# Patient Record
Sex: Female | Born: 1970
Health system: Southern US, Community
[De-identification: ages and names within clinical notes are randomized; demographics above are authoritative.]

## PROBLEM LIST (undated history)

## (undated) DIAGNOSIS — J45909 Unspecified asthma, uncomplicated: Secondary | ICD-10-CM

## (undated) DIAGNOSIS — R296 Repeated falls: Secondary | ICD-10-CM

## (undated) DIAGNOSIS — R011 Cardiac murmur, unspecified: Secondary | ICD-10-CM

## (undated) DIAGNOSIS — G43909 Migraine, unspecified, not intractable, without status migrainosus: Secondary | ICD-10-CM

## (undated) DIAGNOSIS — M810 Age-related osteoporosis without current pathological fracture: Secondary | ICD-10-CM

## (undated) DIAGNOSIS — M797 Fibromyalgia: Secondary | ICD-10-CM

## (undated) HISTORY — DX: Repeated falls: R29.6

## (undated) HISTORY — PX: HAND SURGERY: SHX662

## (undated) HISTORY — PX: CHOLECYSTECTOMY: SHX55

## (undated) HISTORY — PX: KNEE SURGERY: SHX244

## (undated) HISTORY — PX: ABDOMINAL HYSTERECTOMY: SHX81

## (undated) HISTORY — PX: APPENDECTOMY: SHX54

## (undated) HISTORY — DX: Migraine, unspecified, not intractable, without status migrainosus: G43.909

---

## 2016-12-13 ENCOUNTER — Other Ambulatory Visit: Payer: Self-pay | Admitting: Rheumatology

## 2016-12-13 DIAGNOSIS — M47819 Spondylosis without myelopathy or radiculopathy, site unspecified: Secondary | ICD-10-CM

## 2016-12-28 ENCOUNTER — Ambulatory Visit: Payer: Self-pay | Admitting: Rheumatology

## 2017-01-01 ENCOUNTER — Ambulatory Visit
Admission: RE | Admit: 2017-01-01 | Discharge: 2017-01-01 | Disposition: A | Discharge status: Home / Self Care | Payer: 59 | Source: Ambulatory Visit | Attending: Rheumatology | Admitting: Rheumatology

## 2017-01-01 ENCOUNTER — Encounter: Payer: Self-pay | Admitting: Radiology

## 2017-01-01 DIAGNOSIS — M47819 Spondylosis without myelopathy or radiculopathy, site unspecified: Secondary | ICD-10-CM

## 2017-01-01 MED ORDER — GADOBENATE DIMEGLUMINE 529 MG/ML IV SOLN
20.0000 mL | Freq: Once | INTRAVENOUS | Status: AC | PRN
  Administered 2017-01-01: 20 mL via INTRAVENOUS

## 2017-01-07 ENCOUNTER — Other Ambulatory Visit: Payer: Self-pay | Admitting: Rheumatology

## 2017-01-07 DIAGNOSIS — M545 Low back pain: Secondary | ICD-10-CM

## 2017-02-16 ENCOUNTER — Other Ambulatory Visit: Payer: 59

## 2017-07-15 DIAGNOSIS — J40 Bronchitis, not specified as acute or chronic: Secondary | ICD-10-CM | POA: Diagnosis not present

## 2017-08-10 DIAGNOSIS — J111 Influenza due to unidentified influenza virus with other respiratory manifestations: Secondary | ICD-10-CM | POA: Diagnosis not present

## 2017-08-10 DIAGNOSIS — J209 Acute bronchitis, unspecified: Secondary | ICD-10-CM | POA: Diagnosis not present

## 2017-10-29 ENCOUNTER — Emergency Department (HOSPITAL_COMMUNITY): Payer: BLUE CROSS/BLUE SHIELD

## 2017-10-29 ENCOUNTER — Emergency Department (HOSPITAL_COMMUNITY)
Admission: EM | Admit: 2017-10-29 | Discharge: 2017-10-30 | Disposition: A | Discharge status: Home / Self Care | Payer: BLUE CROSS/BLUE SHIELD | Attending: Emergency Medicine | Admitting: Emergency Medicine

## 2017-10-29 ENCOUNTER — Encounter (HOSPITAL_COMMUNITY): Payer: Self-pay

## 2017-10-29 ENCOUNTER — Emergency Department (HOSPITAL_BASED_OUTPATIENT_CLINIC_OR_DEPARTMENT_OTHER)
Admit: 2017-10-29 | Discharge: 2017-10-29 | Disposition: A | Discharge status: Home / Self Care | Payer: BLUE CROSS/BLUE SHIELD

## 2017-10-29 DIAGNOSIS — M79609 Pain in unspecified limb: Secondary | ICD-10-CM | POA: Diagnosis not present

## 2017-10-29 DIAGNOSIS — M79622 Pain in left upper arm: Secondary | ICD-10-CM | POA: Insufficient documentation

## 2017-10-29 DIAGNOSIS — J45909 Unspecified asthma, uncomplicated: Secondary | ICD-10-CM | POA: Insufficient documentation

## 2017-10-29 DIAGNOSIS — Z87891 Personal history of nicotine dependence: Secondary | ICD-10-CM | POA: Insufficient documentation

## 2017-10-29 DIAGNOSIS — M79602 Pain in left arm: Secondary | ICD-10-CM

## 2017-10-29 DIAGNOSIS — M542 Cervicalgia: Secondary | ICD-10-CM | POA: Diagnosis not present

## 2017-10-29 DIAGNOSIS — M7989 Other specified soft tissue disorders: Secondary | ICD-10-CM | POA: Diagnosis not present

## 2017-10-29 DIAGNOSIS — R079 Chest pain, unspecified: Secondary | ICD-10-CM | POA: Diagnosis not present

## 2017-10-29 HISTORY — DX: Age-related osteoporosis without current pathological fracture: M81.0

## 2017-10-29 HISTORY — DX: Unspecified asthma, uncomplicated: J45.909

## 2017-10-29 HISTORY — DX: Fibromyalgia: M79.7

## 2017-10-29 HISTORY — DX: Cardiac murmur, unspecified: R01.1

## 2017-10-29 LAB — CBC
HCT: 42.3 % (ref 36.0–46.0)
Hemoglobin: 14.2 g/dL (ref 12.0–15.0)
MCH: 29.6 pg (ref 26.0–34.0)
MCHC: 33.6 g/dL (ref 30.0–36.0)
MCV: 88.3 fL (ref 78.0–100.0)
Platelets: 236 10*3/uL (ref 150–400)
RBC: 4.79 MIL/uL (ref 3.87–5.11)
RDW: 14.3 % (ref 11.5–15.5)
WBC: 9.5 10*3/uL (ref 4.0–10.5)

## 2017-10-29 LAB — BASIC METABOLIC PANEL
Anion gap: 11 (ref 5–15)
BUN: 11 mg/dL (ref 6–20)
CO2: 23 mmol/L (ref 22–32)
Calcium: 9.3 mg/dL (ref 8.9–10.3)
Chloride: 104 mmol/L (ref 101–111)
Creatinine, Ser: 1.02 mg/dL — ABNORMAL HIGH (ref 0.44–1.00)
GFR calc Af Amer: >60 mL/min (ref >60)
GFR calc non Af Amer: >60 mL/min (ref >60)
Glucose, Bld: 96 mg/dL (ref 65–99)
Potassium: 4 mmol/L (ref 3.5–5.1)
Sodium: 138 mmol/L (ref 135–145)

## 2017-10-29 LAB — I-STAT TROPONIN, ED: Troponin i, poc: 0 ng/mL (ref 0.00–0.08)

## 2017-10-29 MED ORDER — KETOROLAC TROMETHAMINE 30 MG/ML IJ SOLN
30.0000 mg | Freq: Once | INTRAMUSCULAR | Status: AC
  Administered 2017-10-30: 30 mg via INTRAMUSCULAR
  Filled 2017-10-29: qty 1

## 2017-10-29 MED ORDER — NAPROXEN 500 MG PO TABS: 500.0000 mg | ORAL_TABLET | Freq: Two times a day (BID) | ORAL | 0 refills | Status: DC

## 2017-10-29 NOTE — ED Provider Notes (Signed)
Patient placed in Quick Look pathway, seen and evaluated   Chief Complaint: Left upper extremity pain, chest pain  HPI:   Patient with history of fibromyalgia and cardiac murmur presents for evaluation of acute onset, waxing and waning left upper extremity pain which began while she was at work moving socks.  No heavy lifting.  Pain began in the left upper arm and she noted some swelling which improved.  Pain then began to radiate down the rest of the upper extremity.  Worsens with any movement or palpation.  Specifically to the inner arm.  While she was walking to her vehicle she experienced left-sided chest pain and pressure which resolved at rest.  Denies shortness of breath, fevers, numbness, or tingling.  States arm feels heavy.  She has had a DVT in the past when she was pregnant but did not require blood thinners.  No recent travel or surgeries, no hemoptysis, not on OCPs or estrogen therapy.  ROS: Positive for extremity pain, chest pain negative for shortness of breath, fevers, cough, chills  Physical Exam:   Gen: No distress  Neuro: Awake and Alert  Skin: Warm    Focused Exam: 2+ radial pulses bilaterally.  Maximally tender to palpation along the inner aspect of the left upper arm.  No swelling noted.  5/5 strength of BUE major muscle groups.  Heart regular rate and rhythm, lungs clear to auscultation bilaterally.  Mild tenderness to palpation of the left upper anterior chest wall.  No deformity, crepitus, ecchymosis, or paradoxical wall motion   Initiation of care has begun. The patient has been counseled on the process, plan, and necessity for staying for the completion/evaluation, and the remainder of the medical screening examination    Bennye AlmFawze, Viviane Semidey A, PA-C 10/29/17 1617    Wilkie AyeHorton, Mayer Maskerourtney F, MD 10/29/17 2348

## 2017-10-29 NOTE — Discharge Instructions (Addendum)
You were seen today for left arm pain.  The cause of your pain at this time is unknown.  Your ultrasound was negative for DVT.  Your heart testing is reassuring.  Take naproxen for the next 3 to 5 days to see if this helps.  If not improving, follow-up with your primary physician.

## 2017-10-29 NOTE — ED Provider Notes (Signed)
[facility] Provider Note   CSN: [id number] Arrival date & time: 10/29/17  1432     History   Chief Complaint Chief Complaint  Patient presents with  . Arm Pain    HPI Karen Vargas is a 47 y.o. female.  HPI  This is a 47 year old female with a history of fibromyalgia, osteoporosis and remote history of DVT who presents with left arm pain.  Patient reports that she woke up this morning with left arm pain and stiffness.  She thought she slept on her arm wrong.  Pain is mostly in the left upper arm and shoots both downward and upward into her shoulder.  She reports some neck stiffness but no neck pain.  No worsening of pain with neck movement.  Pain is worse with movement at the elbow.  Currently her pain is 6 out of 10.  She describes it as achy.  She denies any numbness or tingling in her fingers.  She denies any weakness.  She has not taken anything for her pain.  She denies any chest pain, shortness of breath, abdominal pain, nausea, vomiting.  Past Medical History:  Diagnosis Date  . Asthma   . Cardiac murmur   . Fibromyalgia   . Osteoporosis     There are no active problems to display for this patient.   Past Surgical History:  Procedure Laterality Date  . ABDOMINAL HYSTERECTOMY    . APPENDECTOMY    . CHOLECYSTECTOMY    . HAND SURGERY Right   . KNEE SURGERY       OB History   None      Home Medications    Prior to Admission medications   Medication Sig Start Date End Date Taking? Authorizing Provider  naproxen (NAPROSYN) 500 MG tablet Take 1 tablet (500 mg total) by mouth 2 (two) times daily. Limit use to 3-5 days 10/29/17   Horton, Mayer Maskerourtney F, MD    Family History History reviewed. No pertinent family history.  Social History Social History   Tobacco Use  . Smoking status: Former Games developermoker  . Smokeless tobacco: Never Used  Substance Use Topics  . Alcohol use: Yes    Comment: occ  . Drug use: Never      Allergies   Doxycycline; Influenza vaccines; and Tramadol   Review of Systems Review of Systems  Constitutional: Negative for fever.  Respiratory: Negative for shortness of breath.   Cardiovascular: Negative for chest pain.  Musculoskeletal: Positive for neck stiffness. Negative for neck pain.       Left arm pain  Neurological: Negative for weakness and numbness.  All other systems reviewed and are negative.    Physical Exam Updated Vital Signs BP 137/85 (BP Location: Right Arm)   Pulse 72   Temp (!) 97.3 F (36.3 C) (Oral)   Resp 18   Ht 5\' 4"  (1.626 m)   Wt 108.9 kg (240 lb)   SpO2 95%   BMI 41.20 kg/m   Physical Exam  Constitutional: She is oriented to person, place, and time. She appears well-developed and well-nourished. No distress.  HENT:  Head: Normocephalic and atraumatic.  Cardiovascular: Normal rate, regular rhythm and normal heart sounds.  Pulmonary/Chest: Effort normal and breath sounds normal. No respiratory distress. She has no wheezes.  Abdominal: Soft. There is no tenderness.  Musculoskeletal:  Normal range of motion of the left shoulder and elbow, no obvious deformity, tenderness to palpation over the left mid humerus, no obvious contusion  or overlying skin changes, 2+ DP pulse No midline C-spine tenderness to palpation, step-off, deformity, no tenderness noted in the brachial plexus  Neurological: She is alert and oriented to person, place, and time.  5 out of 5 grip, biceps, triceps strength bilaterally  Skin: Skin is warm and dry.  Psychiatric: She has a normal mood and affect.  Nursing note and vitals reviewed.    ED Treatments / Results  Labs (all labs ordered are listed, but only abnormal results are displayed) Labs Reviewed  BASIC METABOLIC PANEL - Abnormal; Notable for the following components:      Result Value   Creatinine, Ser 1.02 (*)    All other components within normal limits  CBC  I-STAT TROPONIN, ED    EKG EKG  Interpretation  Date/Time:  Tuesday October 29 2017 16:06:13 EDT Ventricular Rate:  88 PR Interval:  148 QRS Duration: 86 QT Interval:  358 QTC Calculation: 433 R Axis:   -14 Text Interpretation:  Normal sinus rhythm Normal ECG No prior Confirmed by Ross Marcus ([id number]) on 10/29/2017 11:03:45 PM   Radiology Dg Chest 2 View  Result Date: 10/29/2017 CLINICAL DATA:  Worsening LEFT arm pain, tingling, RIGHT painful and constant, single episode of mid chest pain when moving LEFT arm, history asthma, former smoker, fibromyalgia EXAM: CHEST - 2 VIEW COMPARISON:  None FINDINGS: Normal heart size, mediastinal contours, and pulmonary vascularity. Lungs clear. No pleural effusion or pneumothorax. Bones unremarkable. IMPRESSION: Normal exam. Electronically Signed   By: Ulyses Southward M.D.   On: 10/29/2017 16:27    Procedures Procedures (including critical care time)  Medications Ordered in ED Medications  ketorolac (TORADOL) 30 MG/ML injection 30 mg (has no administration in time range)     Initial Impression / Assessment and Plan / ED Course  I have reviewed the triage vital signs and the nursing notes.  Pertinent labs & imaging results that were available during my care of the patient were reviewed by me and considered in my medical decision making (see chart for details).     Patient presents with left arm pain.  Atraumatic.  She has no obvious deformities.  She is neurologically intact.  Considerations include blood clot, radiculopathy, less likely fracture.  Lab work reviewed.  EKG is nonischemic.  Chest x-ray is negative without evidence of pneumothorax or pneumonia.  Ultrasound is negative for clot.  Pain appears musculoskeletal in nature versus radicular.  Recommend anti-inflammatory medications for 3 to 5 days.  Follow-up primary physician if not improving.  After history, exam, and medical workup I feel the patient has been appropriately medically screened and is safe for discharge  home. Pertinent diagnoses were discussed with the patient. Patient was given return precautions.   Final Clinical Impressions(s) / ED Diagnoses   Final diagnoses:  Left arm pain    ED Discharge Orders        Ordered    naproxen (NAPROSYN) 500 MG tablet  2 times daily     10/29/17 2347       Horton, Mayer Masker, MD 10/29/17 2355

## 2017-10-29 NOTE — ED Triage Notes (Signed)
Pt woke up this morning with left arm pain that is worsening.  Pain in upper and lower arm, very painful, tingling, and constant.  No respiratory difficulties.  Pt had one episode while moving left arm of mid chest pain.  No appetite today.

## 2017-10-29 NOTE — ED Notes (Signed)
Called Pt for update. No answer

## 2017-10-29 NOTE — Progress Notes (Signed)
Preliminary results by tech - Left Upper Ext. Venous Duplex Completed. Negative for deep and superficial vein thrombosis. Jermane Brayboy, BS, RDMS, RVT  

## 2017-10-29 NOTE — ED Notes (Signed)
ED Provider at bedside. 

## 2017-10-30 NOTE — ED Notes (Signed)
Sign pad malfunctioned; pt unable to sign-Monique,RN

## 2018-01-27 DIAGNOSIS — S161XXA Strain of muscle, fascia and tendon at neck level, initial encounter: Secondary | ICD-10-CM | POA: Diagnosis not present

## 2018-07-05 IMAGING — MR MR PELVIS WO/W CM
4 of 8 series · 19 of 48 positions shown · IV contrast (multihance)
Comparison: None.

CLINICAL DATA: Back pain. Radiates down the right hip pain
sometimes left hip.

EXAM:
MRI PELVIS WITHOUT AND WITH CONTRAST
TECHNIQUE: Multiplanar multisequence MR imaging of the pelvis was performed
both before and after administration of intravenous contrast.
CONTRAST:  20mL MULTIHANCE GADOBENATE DIMEGLUMINE 529 MG/ML IV SOLN

[Series 4: T1 · sagittal · 4.0mm · 0.43mm/px · 6 of 30 slices shown (1 of 3)]
[im 1/30]
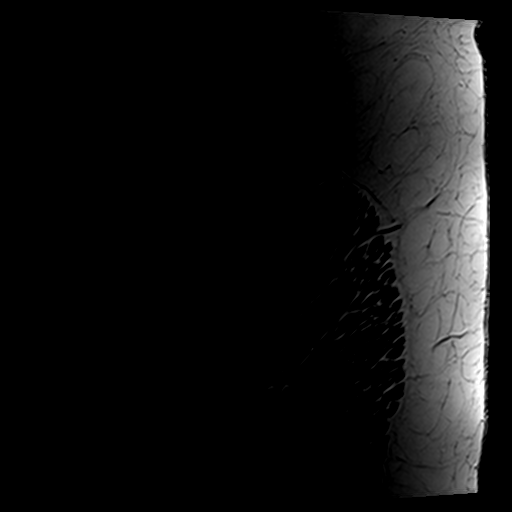
[im 6/30]
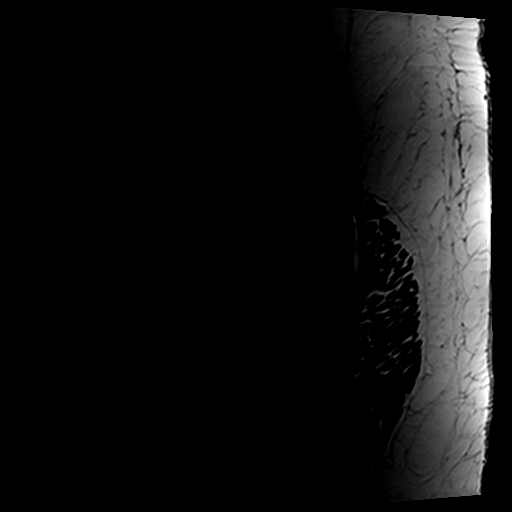
[im 12/30]
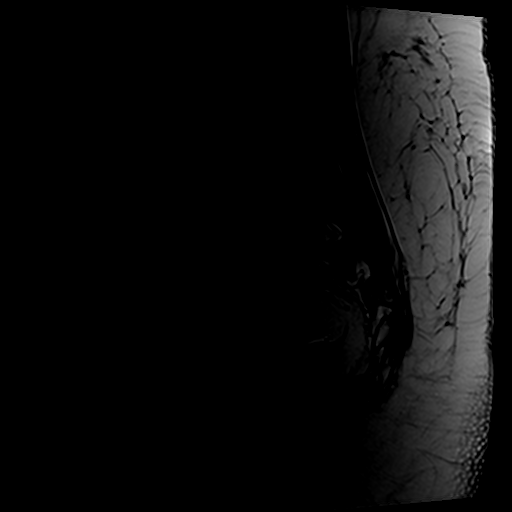
[im 18/30]
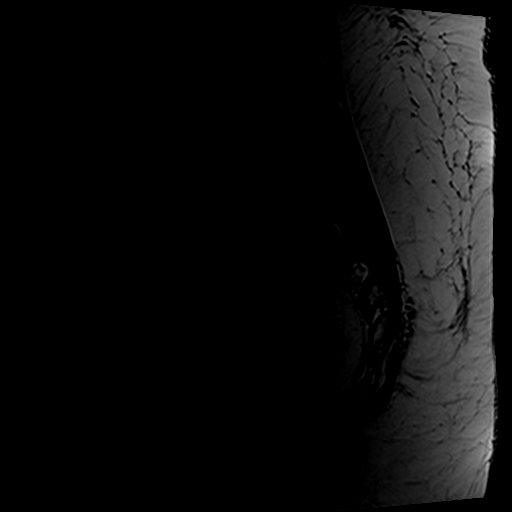
[im 24/30]
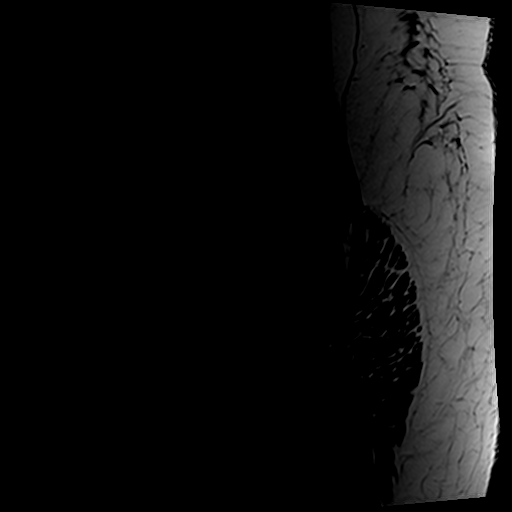
[im 30/30]
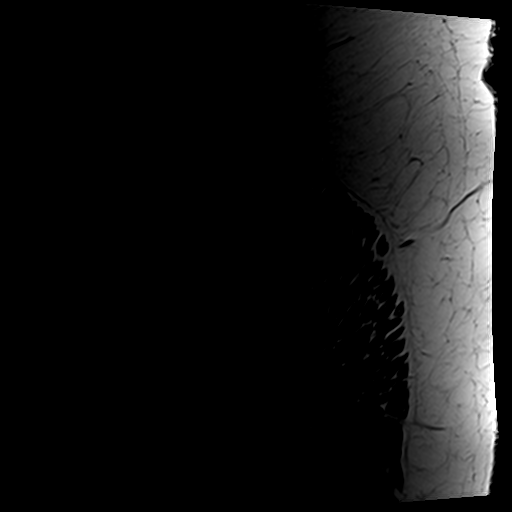

[Series 5: T1 · oblique · 5.0mm · 0.43mm/px · 6 of 30 slices shown (2 of 3)]
[im 1/30]
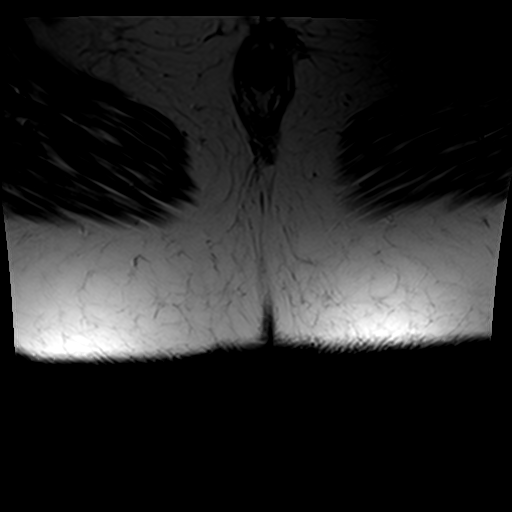
[im 6/30]
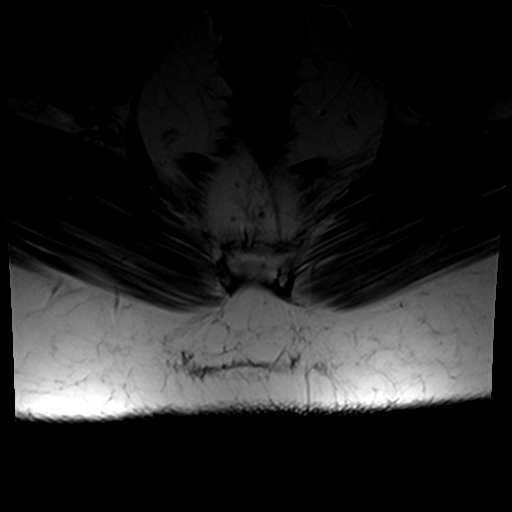
[im 12/30]
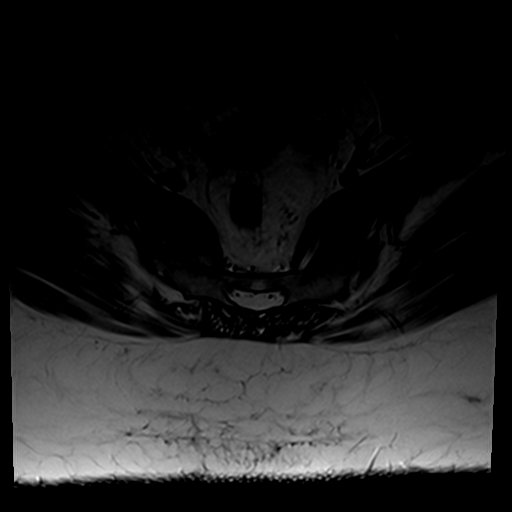
[im 18/30]
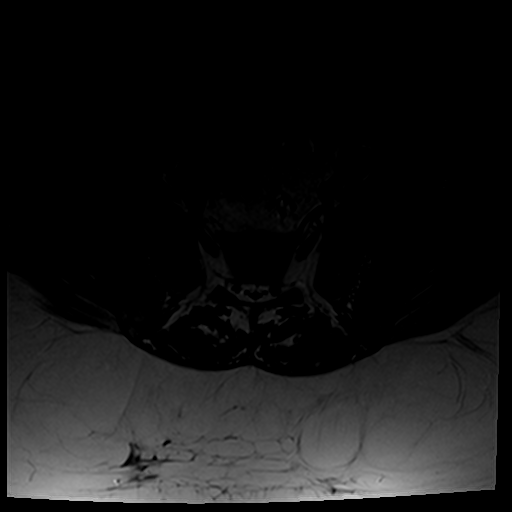
[im 24/30]
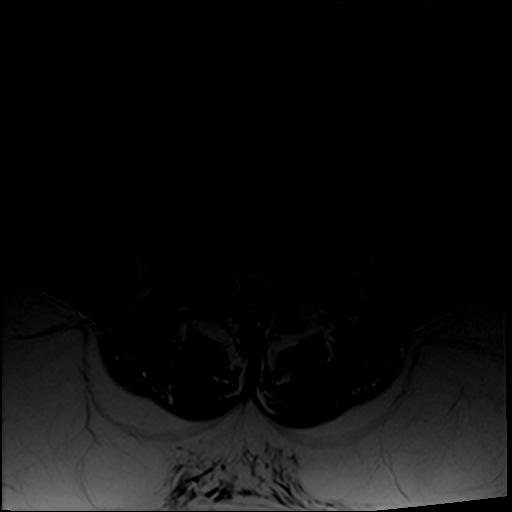
[im 30/30]
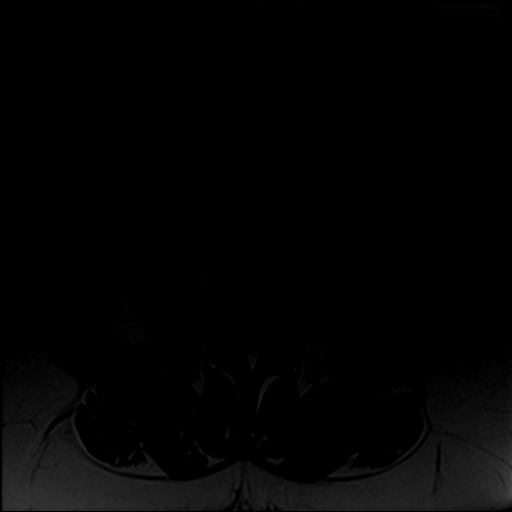

[Series 6: T1 fat-sat · oblique · non-contrast · 5.0mm · 0.43mm/px · 4 of 30 slices shown]
[im 1/30]
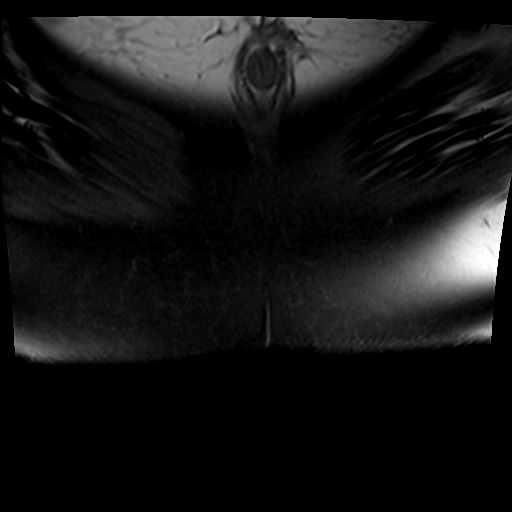
[im 5/30]
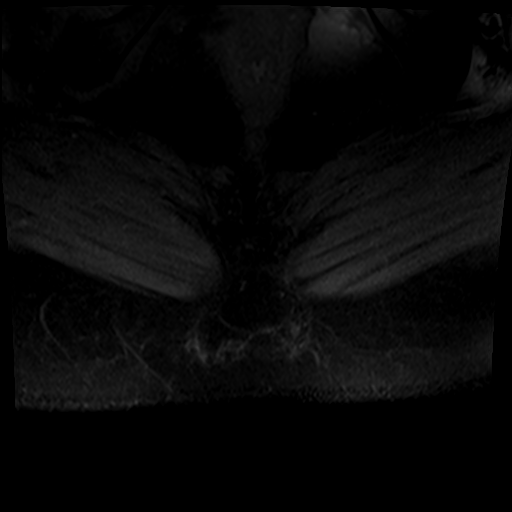
[im 15/30]
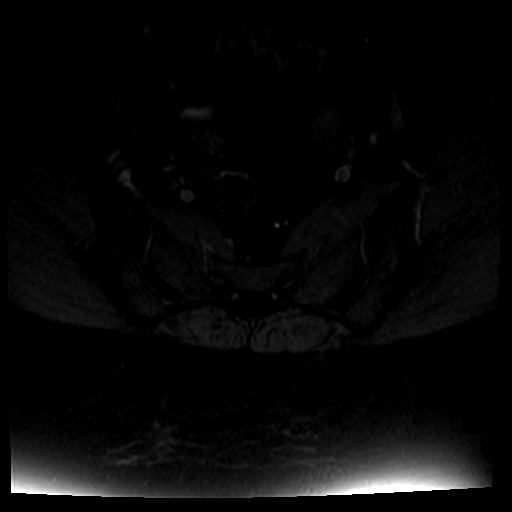
[im 25/30]
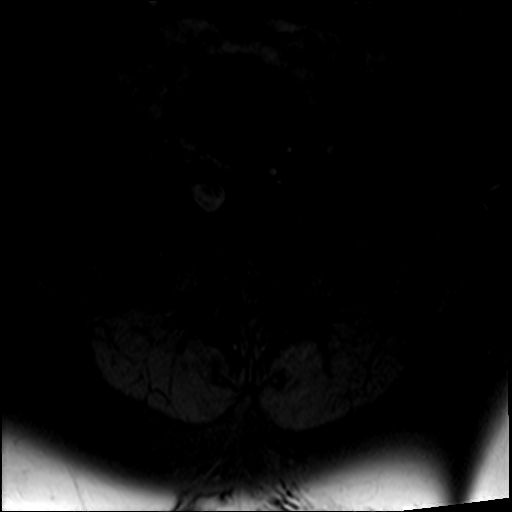

[Series 8: T1 · coronal · 5.0mm · 0.43mm/px · 3 of 22 slices shown (3 of 3)]
[im 1/22]
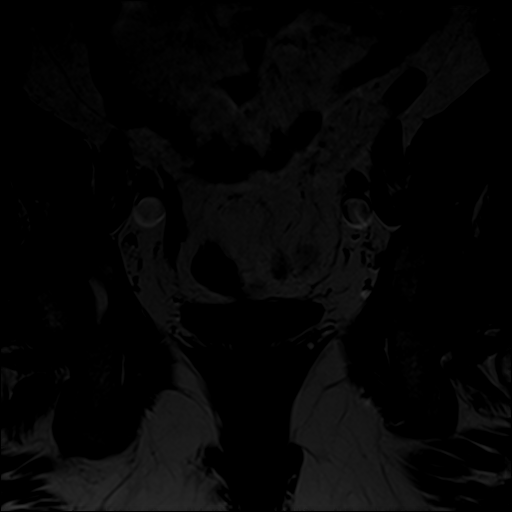
[im 11/22]
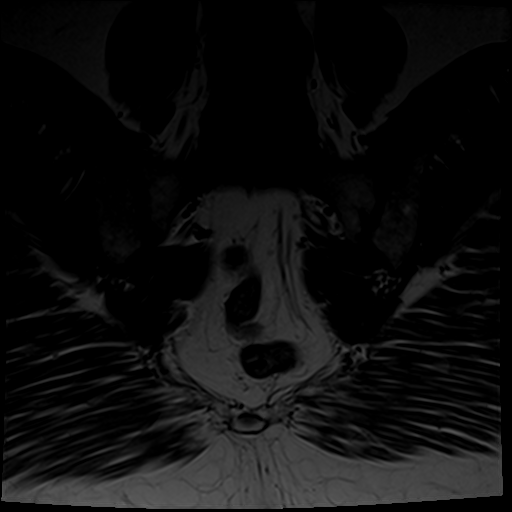
[im 22/22]
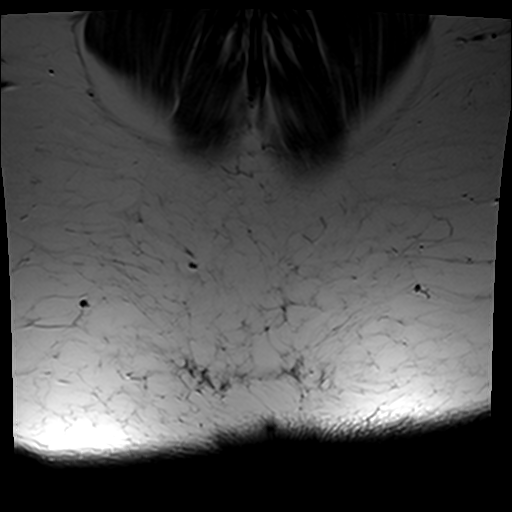

[19 of 48 positions shown; findings below may reference images not displayed]

FINDINGS: Bones/Joint/Cartilage

No marrow signal abnormality. No fracture or dislocation. Normal
alignment. No joint effusion.

Normal sacroiliac joints. No SI joint widening or erosive changes.
No hypertrophic changes of bilateral sacroiliac joints. No SI joint
effusion. No abnormal enhancement of the sacroiliac joints.

Mild bilateral facet arthropathy at L5-S1. Mild bilateral facet
arthropathy at L4-5.

Ligaments, Muscles and Tendons
Muscles are normal. No muscle atrophy or muscle edema. Piriformis
muscle is normal bilaterally and symmetric.

Soft tissue
No fluid collection or hematoma. No soft tissue mass. Visualized
sciatic nerves are normal bilaterally.
IMPRESSION: 1. Normal bilateral sacroiliac joints.

## 2018-08-26 DIAGNOSIS — J0140 Acute pansinusitis, unspecified: Secondary | ICD-10-CM | POA: Diagnosis not present

## 2018-11-22 DIAGNOSIS — J209 Acute bronchitis, unspecified: Secondary | ICD-10-CM | POA: Diagnosis not present

## 2018-11-22 DIAGNOSIS — J45909 Unspecified asthma, uncomplicated: Secondary | ICD-10-CM | POA: Diagnosis not present

## 2018-11-22 DIAGNOSIS — J069 Acute upper respiratory infection, unspecified: Secondary | ICD-10-CM | POA: Diagnosis not present

## 2019-01-01 DIAGNOSIS — G5601 Carpal tunnel syndrome, right upper limb: Secondary | ICD-10-CM | POA: Insufficient documentation

## 2019-01-01 DIAGNOSIS — S161XXA Strain of muscle, fascia and tendon at neck level, initial encounter: Secondary | ICD-10-CM

## 2019-01-01 HISTORY — DX: Carpal tunnel syndrome, right upper limb: G56.01

## 2019-01-01 HISTORY — DX: Strain of muscle, fascia and tendon at neck level, initial encounter: S16.1XXA

## 2019-01-20 ENCOUNTER — Telehealth: Payer: Self-pay

## 2019-01-20 NOTE — Telephone Encounter (Signed)
Left message for patient to call back to schedule new patient appt for procedural clearance and to verify current PCP information.

## 2019-01-26 ENCOUNTER — Ambulatory Visit (INDEPENDENT_AMBULATORY_CARE_PROVIDER_SITE_OTHER): Payer: Self-pay | Admitting: Cardiology

## 2019-01-26 ENCOUNTER — Encounter: Payer: Self-pay | Admitting: Cardiology

## 2019-01-26 ENCOUNTER — Other Ambulatory Visit: Payer: Self-pay

## 2019-01-26 VITALS — BP 126/82 | HR 99 | Ht 64.0 in | Wt 238.0 lb

## 2019-01-26 DIAGNOSIS — R0789 Other chest pain: Secondary | ICD-10-CM

## 2019-01-26 DIAGNOSIS — Z01810 Encounter for preprocedural cardiovascular examination: Secondary | ICD-10-CM

## 2019-01-26 DIAGNOSIS — R011 Cardiac murmur, unspecified: Secondary | ICD-10-CM

## 2019-01-26 HISTORY — DX: Encounter for preprocedural cardiovascular examination: Z01.810

## 2019-01-26 HISTORY — DX: Morbid (severe) obesity due to excess calories: E66.01

## 2019-01-26 HISTORY — DX: Other chest pain: R07.89

## 2019-01-26 NOTE — Progress Notes (Signed)
Cardiology Office Note:    Date:  01/26/2019   ID:  Karen Vargas, DOB June 09, 1971, MRN [id number]  PCP:  Patient, No Pcp Per  Cardiologist:  Jenean Lindau, MD   Referring MD: No ref. provider found    ASSESSMENT:    1. Preoperative cardiovascular examination   2. Chest tightness   3. Morbid obesity ([place])    PLAN:    In order of problems listed above:  1. Primary prevention stressed with the patient.  Importance of compliance with diet and medication stressed and she vocalized understanding.  Her blood pressure stable.  Diet was discussed for obesity and risk of obesity explained and she vocalized understanding and she plans to do better. 2. Dyspnea on exertion and chest tightness: In view of risk factors for coronary artery disease we will do a Lexiscan sestamibi.  Echocardiogram will be done to assess murmur heard on auscultation.  If these tests are negative then she is fine to undergo the physical fitness ability evaluation by her orthopedic doctor. 3. Patient will be seen in follow-up appointment in 6 months or earlier if the patient has any concerns.  Patient knows to go to the nearest emergency room for any concerning symptoms.   Medication Adjustments/Labs and Tests Ordered: Current medicines are reviewed at length with the patient today.  Concerns regarding medicines are outlined above.  No orders of the defined types were placed in this encounter.  No orders of the defined types were placed in this encounter.    History of Present Illness:    Karen Vargas is a 48 y.o. female who is being seen today for the evaluation of preoperative cardiovascular evaluation for chest tightness.  Patient is a pleasant 48 year old female.  She has past medical history of bronchial asthma.  She mentions to me that she has chest tightness at times.  She also has some shortness of breath more than usual.  These are chronic complaints.  She is planning to undergo a complete evaluation for  a getting back to work and physical ability evaluation and because of her history mentioned above her doctor wanted her to get a cardiac evaluation.  Her chest tightness is not related to exertion.  Occasionally she it radiates to the neck.  No orthopnea or PND.  She leads a sedentary lifestyle.  At the time of my evaluation, the patient is alert awake oriented and in no distress.  Past Medical History:  Diagnosis Date   Asthma    Cardiac murmur    Fibromyalgia    Osteoporosis     Past Surgical History:  Procedure Laterality Date   ABDOMINAL HYSTERECTOMY     APPENDECTOMY     CHOLECYSTECTOMY     HAND SURGERY Right    KNEE SURGERY      Current Medications: Current Meds  Medication Sig   acetaminophen (TYLENOL) 500 MG tablet Take 500 mg by mouth every 6 (six) hours as needed.   albuterol (VENTOLIN HFA) 108 (90 Base) MCG/ACT inhaler albuterol sulfate HFA 90 mcg/actuation aerosol inhaler   budesonide-formoterol (SYMBICORT) 80-4.5 MCG/ACT inhaler Inhale 2 puffs into the lungs 2 (two) times daily.   tiZANidine (ZANAFLEX) 4 MG tablet Take 4 mg by mouth at bedtime.     Allergies:   Doxycycline, Gabapentin, Influenza vaccines, Prednisone, and Tramadol   Social History   Socioeconomic History   Marital status: Married    Spouse name: Not on file   Number of children: Not on file  Years of education: Not on file   Highest education level: Not on file  Occupational History   Not on file  Social Needs   Financial resource strain: Not on file   Food insecurity    Worry: Not on file    Inability: Not on file   Transportation needs    Medical: Not on file    Non-medical: Not on file  Tobacco Use   Smoking status: Former Smoker   Smokeless tobacco: Never Used  Substance and Sexual Activity   Alcohol use: Yes    Comment: occ   Drug use: Never   Sexual activity: Not on file  Lifestyle   Physical activity    Days per week: Not on file    Minutes per  session: Not on file   Stress: Not on file  Relationships   Social connections    Talks on phone: Not on file    Gets together: Not on file    Attends religious service: Not on file    Active member of club or organization: Not on file    Attends meetings of clubs or organizations: Not on file    Relationship status: Not on file  Other Topics Concern   Not on file  Social History Narrative   Not on file     Family History: The patient's family history is not on file.  ROS:   Please see the history of present illness.    All other systems reviewed and are negative.  EKGs/Labs/Other Studies Reviewed:    The following studies were reviewed today: EKG reveals sinus rhythm and nonspecific ST-T changes   Recent Labs: No results found for requested labs within last 8760 hours.  Recent Lipid Panel No results found for: CHOL, TRIG, HDL, CHOLHDL, VLDL, LDLCALC, LDLDIRECT  Physical Exam:    VS:  BP 126/82 (BP Location: Left Arm, Patient Position: Sitting, Cuff Size: Normal)    Pulse 99    Ht 5\' 4"  (1.626 m)    Wt 238 lb (108 kg)    SpO2 98%    BMI 40.85 kg/m     Wt Readings from Last 3 Encounters:  01/26/19 238 lb (108 kg)  10/29/17 240 lb (108.9 kg)     GEN: Patient is in no acute distress HEENT: Normal NECK: No JVD; No carotid bruits LYMPHATICS: No lymphadenopathy CARDIAC: S1 S2 regular, 2/6 systolic murmur at the apex. RESPIRATORY:  Clear to auscultation without rales, wheezing or rhonchi  ABDOMEN: Soft, non-tender, non-distended MUSCULOSKELETAL:  No edema; No deformity  SKIN: Warm and dry NEUROLOGIC:  Alert and oriented x 3 PSYCHIATRIC:  Normal affect    Signed, Garwin Brothersajan R Dashonna Chagnon, MD  01/26/2019 3:49 PM    [facility]

## 2019-01-26 NOTE — Telephone Encounter (Signed)
Left another vm for pt to call office to schedule f/u appt with Dr. Docia Furl to verify cardiac clearance for emergeortho.Requested current labs, EKG, office note form Dr. Marcello Moores office.

## 2019-01-26 NOTE — Addendum Note (Signed)
Addended by: Beckey Rutter on: 01/26/2019 04:12 PM   Modules accepted: Orders

## 2019-01-26 NOTE — Patient Instructions (Addendum)
Medication Instructions:  Your physician recommends that you continue on your current medications as directed. Please refer to the Current Medication list given to you today.  If you need a refill on your cardiac medications before your next appointment, please call your pharmacy.   Lab work: NONE If you have labs (blood work) drawn today and your tests are completely normal, you will receive your results only by:  [facility] (if you have MyChart) OR  A paper copy in the mail If you have any lab test that is abnormal or we need to change your treatment, we will call you to review the results.  Testing/Procedures: Your physician has requested that you have an echocardiogram. Echocardiography is a painless test that uses sound waves to create images of your heart. It provides your doctor with information about the size and shape of your heart and how well your hearts chambers and valves are working. This procedure takes approximately one hour. There are no restrictions for this procedure.  Your physician has requested that you have a lexiscan myoview. For further information please visit [email or web address]. Please follow instruction sheet, as given.     [facility] [address] [place], [place] [address] Phone:  [phone]  January 26, 2019    IDELL HISSONG DOB: 07-02-1971 MRN: [id number] [place] [address]   Dear Ms. Brackin,  You are scheduled for a Myocardial Perfusion Imaging Study on:  02/24/19 at 0800.  Please arrive 15 minutes prior to your appointment time for registration and insurance purposes.  The test will take approximately 3 to 4 hours to complete; you may bring reading material.  If someone comes with you to your appointment, they will need to remain in the main lobby due to limited space in the testing area. **If you are pregnant or breastfeeding, please notify the nuclear lab prior to your appointment**  How to  prepare for your Myocardial Perfusion Test:  Do not eat or drink 3 hours prior to your test, except you may have water.  Do not consume products containing caffeine (regular or decaffeinated) 12 hours prior to your test. (ex: coffee, chocolate, sodas, tea).  Do bring a list of your current medications with you.  If not listed below, you may take your medications as normal.  Do wear comfortable clothes (no dresses or overalls) and walking shoes, tennis shoes preferred (No heels or open toe shoes are allowed).  Do NOT wear cologne, perfume, aftershave, or lotions (deodorant is allowed).  If these instructions are not followed, your test will have to be rescheduled.  Please report to [address] for your test.  If you have questions or concerns about your appointment, you can call the [facility] at [phone].  If you cannot keep your appointment, please provide 24 hours notification to the Nuclear Lab, to avoid a possible $50 charge to your account.   Follow-Up: At [facility], you and your health needs are our priority.  As part of our continuing mission to provide you with exceptional heart care, we have created designated Provider Care Teams.  These Care Teams include your primary Cardiologist (physician) and Advanced Practice Providers (APPs -  Physician Assistants and Nurse Practitioners) who all work together to provide you with the care you need, when you need it. You will need a follow up appointment in 6 months.   Any Other Special Instructions Will Be Listed Below Regadenoson injection What  is this medicine? REGADENOSON is used to test the heart for coronary artery disease. It is used in patients who can not exercise for their stress test. This medicine may be used for other purposes; ask your health care provider or pharmacist if you have questions. COMMON BRAND NAME(S): Lexiscan What should I tell my health care provider before I  take this medicine? They need to know if you have any of these conditions:  heart problems  lung or breathing disease, like asthma or COPD  an unusual or allergic reaction to regadenoson, other medicines, foods, dyes, or preservatives  pregnant or trying to get pregnant  breast-feeding How should I use this medicine? This medicine is for injection into a vein. It is given by a health care professional in a hospital or clinic setting. Talk to your pediatrician regarding the use of this medicine in children. Special care may be needed. Overdosage: If you think you have taken too much of this medicine contact a poison control center or emergency room at once. NOTE: This medicine is only for you. Do not share this medicine with others. What if I miss a dose? This does not apply. What may interact with this medicine?  caffeine  dipyridamole  guarana  theophylline This list may not describe all possible interactions. Give your health care provider a list of all the medicines, herbs, non-prescription drugs, or dietary supplements you use. Also tell them if you smoke, drink alcohol, or use illegal drugs. Some items may interact with your medicine. What should I watch for while using this medicine? Your condition will be monitored carefully while you are receiving this medicine. Do not take medicines, foods, or drinks with caffeine (like coffee, tea, or colas) for at least 12 hours before your test. If you do not know if something contains caffeine, ask your health care professional. What side effects may I notice from receiving this medicine? Side effects that you should report to your doctor or health care professional as soon as possible:  allergic reactions like skin rash, itching or hives, swelling of the face, lips, or tongue  breathing problems  chest pain, tightness or palpitations  severe headache Side effects that usually do not require medical attention (report to your  doctor or health care professional if they continue or are bothersome):  flushing  headache  irritation or pain at site where injected  nausea, vomiting This list may not describe all possible side effects. Call your doctor for medical advice about side effects. You may report side effects to FDA at 1-800-FDA-1088. Where should I keep my medicine? This drug is given in a hospital or clinic and will not be stored at home. NOTE: This sheet is a summary. It may not cover all possible information. If you have questions about this medicine, talk to your doctor, pharmacist, or health care provider.  2020 Elsevier/Gold Standard (2008-02-23 15:08:13)   Cardiac Nuclear Scan A cardiac nuclear scan is a test that is done to check the flow of blood to your heart. It is done when you are resting and when you are exercising. The test looks for problems such as:  Not enough blood reaching a portion of the heart.  The heart muscle not working as it should. You may need this test if:  You have heart disease.  You have had lab results that are not normal.  You have had heart surgery or a balloon procedure to open up blocked arteries (angioplasty).  You have chest pain.  You have shortness of breath. In this test, a special dye (tracer) is put into your bloodstream. The tracer will travel to your heart. A camera will then take pictures of your heart to see how the tracer moves through your heart. This test is usually done at a hospital and takes 2-4 hours. Tell a doctor about:  Any allergies you have.  All medicines you are taking, including vitamins, herbs, eye drops, creams, and over-the-counter medicines.  Any problems you or family members have had with anesthetic medicines.  Any blood disorders you have.  Any surgeries you have had.  Any medical conditions you have.  Whether you are pregnant or may be pregnant. What are the risks? Generally, this is a safe test. However, problems  may occur, such as:  Serious chest pain and heart attack. This is only a risk if the stress portion of the test is done.  Rapid heartbeat.  A feeling of warmth in your chest. This feeling usually does not last long.  Allergic reaction to the tracer. What happens before the test?  Ask your doctor about changing or stopping your normal medicines. This is important.  Follow instructions from your doctor about what you cannot eat or drink.  Remove your jewelry on the day of the test. What happens during the test?  An IV tube will be inserted into one of your veins.  Your doctor will give you a small amount of tracer through the IV tube.  You will wait for 20-40 minutes while the tracer moves through your bloodstream.  Your heart will be monitored with an electrocardiogram (ECG).  You will lie down on an exam table.  Pictures of your heart will be taken for about 15-20 minutes.  You may also have a stress test. For this test, one of these things may be done: ? You will be asked to exercise on a treadmill or a stationary bike. ? You will be given medicines that will make your heart work harder. This is done if you are unable to exercise.  When blood flow to your heart has peaked, a tracer will again be given through the IV tube.  After 20-40 minutes, you will get back on the exam table. More pictures will be taken of your heart.  Depending on the tracer that is used, more pictures may need to be taken 3-4 hours later.  Your IV tube will be removed when the test is over. The test may vary among doctors and hospitals. What happens after the test?  Ask your doctor: ? Whether you can return to your normal schedule, including diet, activities, and medicines. ? Whether you should drink more fluids. This will help to remove the tracer from your body. Drink enough fluid to keep your pee (urine) pale yellow.  Ask your doctor, or the department that is doing the test: ? When will my  results be ready? ? How will I get my results? Summary  A cardiac nuclear scan is a test that is done to check the flow of blood to your heart.  Tell your doctor whether you are pregnant or may be pregnant.  Before the test, ask your doctor about changing or stopping your normal medicines. This is important.  Ask your doctor whether you can return to your normal activities. You may be asked to drink more fluids. This information is not intended to replace advice given to you by your health care provider. Make sure you discuss any questions you have  with your health care provider. Document Released: 12/09/2017 Document Revised: 10/15/2018 Document Reviewed: 12/09/2017 Elsevier Patient Education  2020 [employer]  Echocardiogram An echocardiogram is a procedure that uses painless sound waves (ultrasound) to produce an image of the heart. Images from an echocardiogram can provide important information about:  Signs of coronary artery disease (CAD).  Aneurysm detection. An aneurysm is a weak or damaged part of an artery wall that bulges out from the normal force of blood pumping through the body.  Heart size and shape. Changes in the size or shape of the heart can be associated with certain conditions, including heart failure, aneurysm, and CAD.  Heart muscle function.  Heart valve function.  Signs of a past heart attack.  Fluid buildup around the heart.  Thickening of the heart muscle.  A tumor or infectious growth around the heart valves. Tell a health care provider about:  Any allergies you have.  All medicines you are taking, including vitamins, herbs, eye drops, creams, and over-the-counter medicines.  Any blood disorders you have.  Any surgeries you have had.  Any medical conditions you have.  Whether you are pregnant or may be pregnant. What are the risks? Generally, this is a safe procedure. However, problems may occur, including:  Allergic reaction to dye  (contrast) that may be used during the procedure. What happens before the procedure? No specific preparation is needed. You may eat and drink normally. What happens during the procedure?   An IV tube may be inserted into one of your veins.  You may receive contrast through this tube. A contrast is an injection that improves the quality of the pictures from your heart.  A gel will be applied to your chest.  A wand-like tool (transducer) will be moved over your chest. The gel will help to transmit the sound waves from the transducer.  The sound waves will harmlessly bounce off of your heart to allow the heart images to be captured in real-time motion. The images will be recorded on a computer. The procedure may vary among health care providers and hospitals. What happens after the procedure?  You may return to your normal, everyday life, including diet, activities, and medicines, unless your health care provider tells you not to do that. Summary  An echocardiogram is a procedure that uses painless sound waves (ultrasound) to produce an image of the heart.  Images from an echocardiogram can provide important information about the size and shape of your heart, heart muscle function, heart valve function, and fluid buildup around your heart.  You do not need to do anything to prepare before this procedure. You may eat and drink normally.  After the echocardiogram is completed, you may return to your normal, everyday life, unless your health care provider tells you not to do that. This information is not intended to replace advice given to you by your health care provider. Make sure you discuss any questions you have with your health care provider. Document Released: 06/22/2000 Document Revised: 10/16/2018 Document Reviewed: 07/28/2016 Elsevier Patient Education  2020 [employer]

## 2019-01-27 NOTE — Addendum Note (Signed)
Addended by: Beckey Rutter on: 01/27/2019 08:25 AM   Modules accepted: Orders

## 2019-01-29 ENCOUNTER — Telehealth: Payer: Self-pay | Admitting: Cardiology

## 2019-01-29 NOTE — Telephone Encounter (Signed)
Has questions about paperwork for work

## 2019-02-02 NOTE — Telephone Encounter (Signed)
Patient requested RN fax copy of last office note to [employer]. Information was sent as request.

## 2019-02-17 ENCOUNTER — Telehealth (HOSPITAL_COMMUNITY): Payer: Self-pay | Admitting: *Deleted

## 2019-02-17 DIAGNOSIS — R1084 Generalized abdominal pain: Secondary | ICD-10-CM | POA: Diagnosis not present

## 2019-02-17 NOTE — Telephone Encounter (Signed)
Left message on voicemail per DPR in reference to upcoming appointment scheduled on 02/24/19 with detailed instructions given per Myocardial Perfusion Study Information Sheet for the test. LM to arrive 15 minutes early, and that it is imperative to arrive on time for appointment to keep from having the test rescheduled. If you need to cancel or reschedule your appointment, please call the office within 24 hours of your appointment. Failure to do so may result in a cancellation of your appointment, and a $50 no show fee. Phone number given for call back for any questions. Crissy Mccreadie Jacqueline    

## 2019-03-12 ENCOUNTER — Other Ambulatory Visit: Payer: Self-pay

## 2019-05-02 IMAGING — CR DG CHEST 2V
2 series · 2 of 2 positions shown · non-contrast
Comparison: None

CLINICAL DATA: Worsening LEFT arm pain, tingling, RIGHT painful and
constant, single episode of mid chest pain when moving LEFT arm,
history asthma, former smoker, fibromyalgia

EXAM:
CHEST - 2 VIEW

[chest pa]
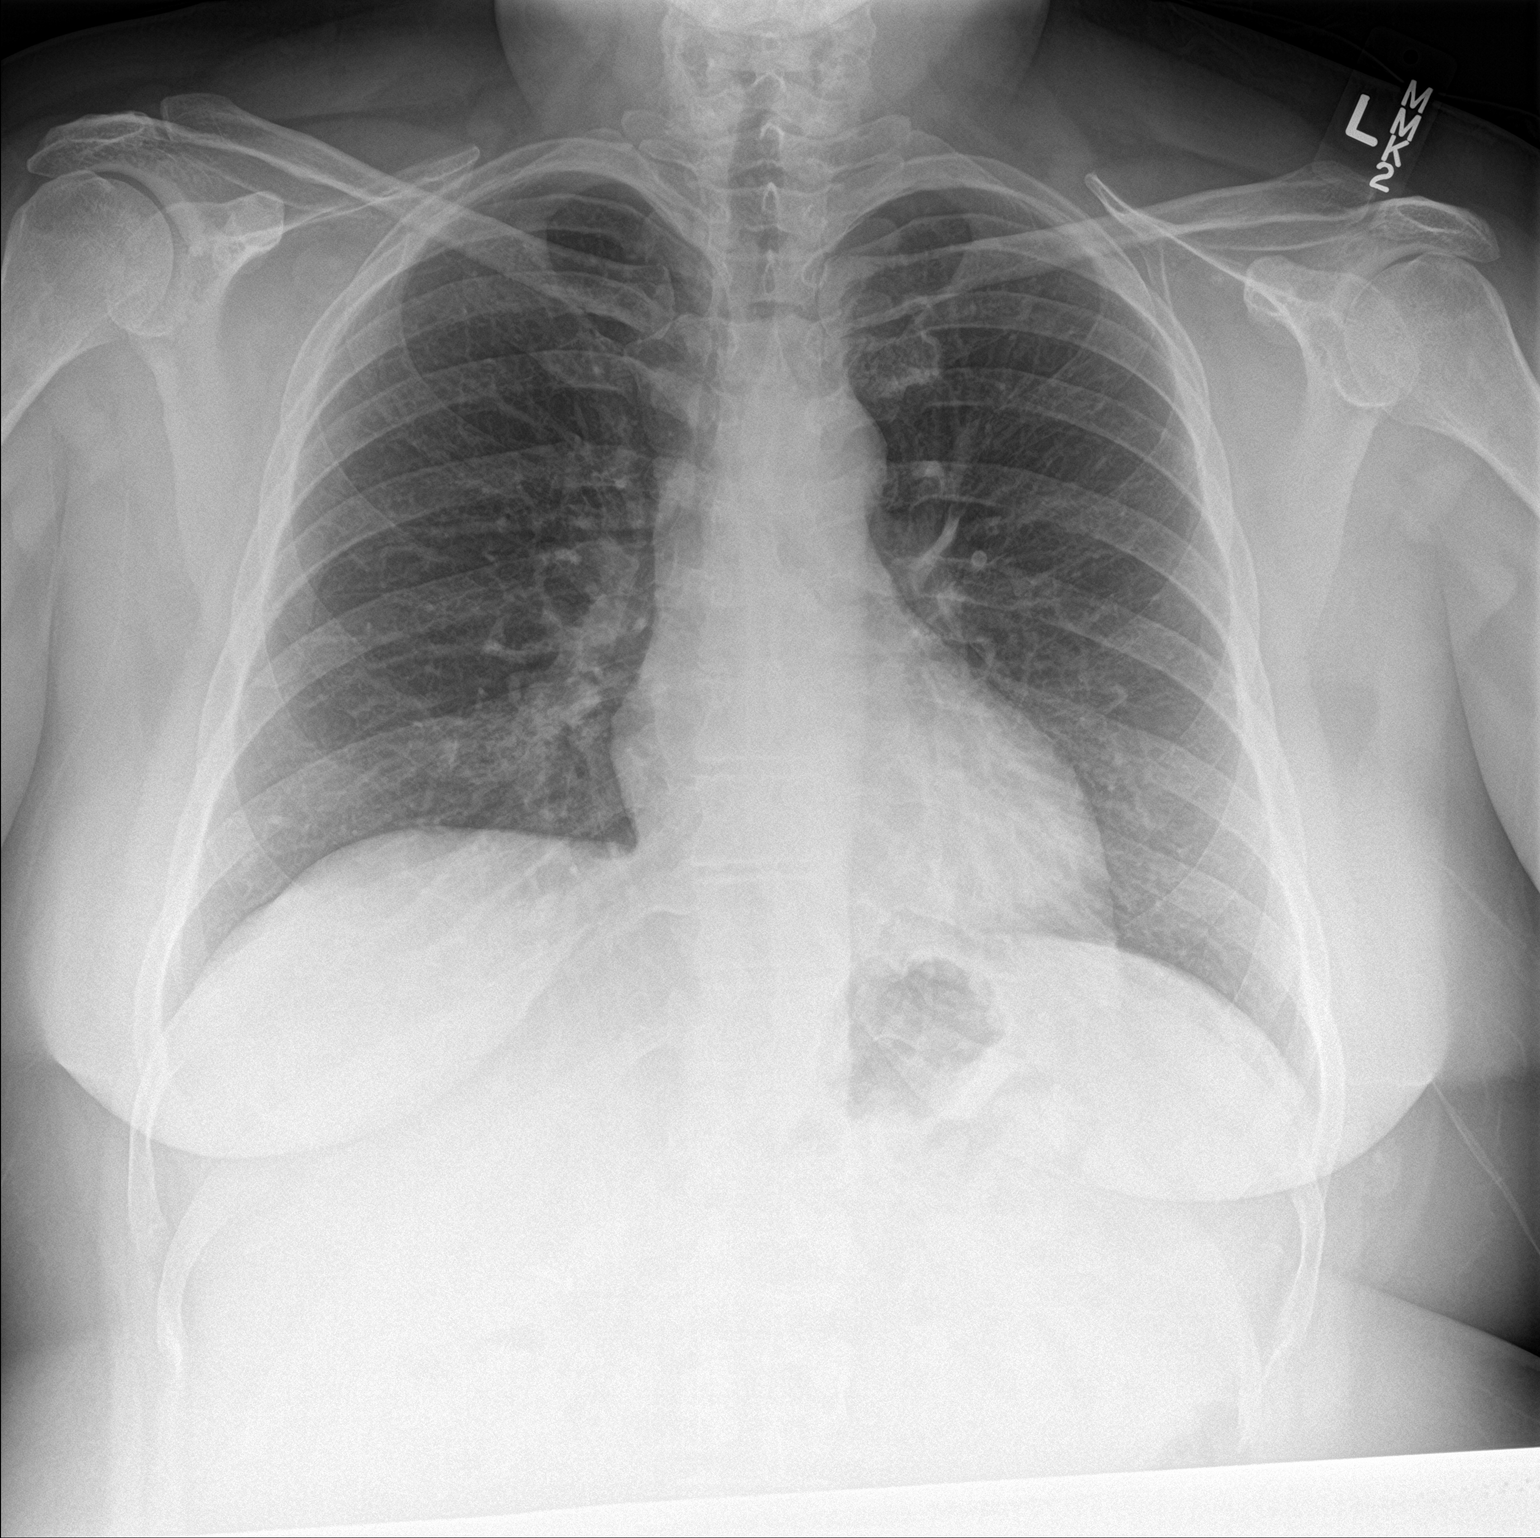

[chest lat]
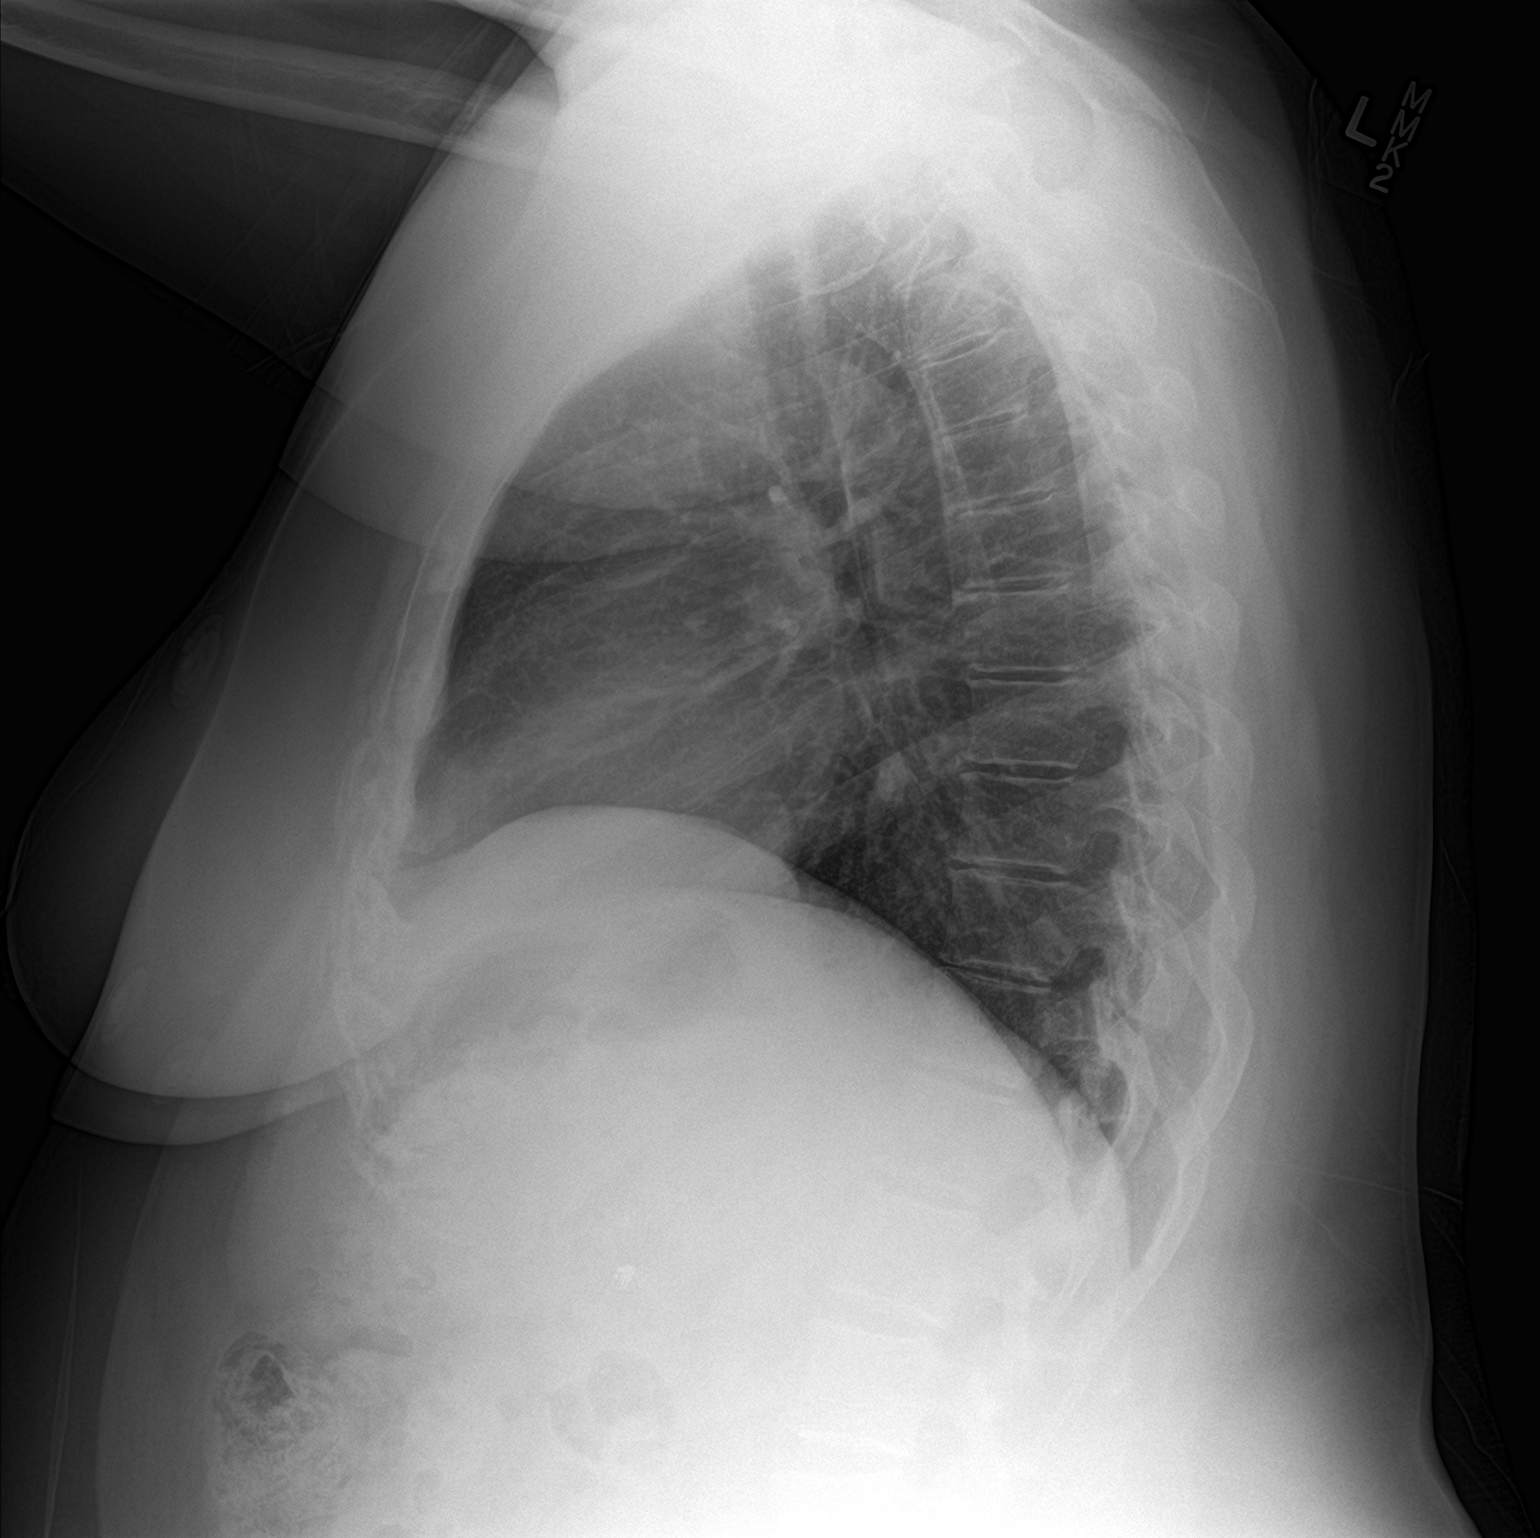

[2 of 2 positions shown; findings below may reference images not displayed]

FINDINGS: Normal heart size, mediastinal contours, and pulmonary vascularity.

Lungs clear.

No pleural effusion or pneumothorax.

Bones unremarkable.
IMPRESSION: Normal exam.

## 2019-05-08 DIAGNOSIS — L509 Urticaria, unspecified: Secondary | ICD-10-CM | POA: Diagnosis not present

## 2019-05-08 DIAGNOSIS — N3090 Cystitis, unspecified without hematuria: Secondary | ICD-10-CM | POA: Diagnosis not present

## 2019-06-05 DIAGNOSIS — J069 Acute upper respiratory infection, unspecified: Secondary | ICD-10-CM | POA: Diagnosis not present

## 2019-06-05 DIAGNOSIS — U071 COVID-19: Secondary | ICD-10-CM | POA: Diagnosis not present

## 2019-06-05 DIAGNOSIS — J4540 Moderate persistent asthma, uncomplicated: Secondary | ICD-10-CM | POA: Diagnosis not present

## 2019-06-13 DIAGNOSIS — J9600 Acute respiratory failure, unspecified whether with hypoxia or hypercapnia: Secondary | ICD-10-CM | POA: Diagnosis not present

## 2019-06-13 DIAGNOSIS — R05 Cough: Secondary | ICD-10-CM | POA: Diagnosis not present

## 2019-06-13 DIAGNOSIS — R0602 Shortness of breath: Secondary | ICD-10-CM | POA: Diagnosis not present

## 2019-06-13 DIAGNOSIS — U071 COVID-19: Secondary | ICD-10-CM | POA: Diagnosis not present

## 2019-06-13 DIAGNOSIS — J1289 Other viral pneumonia: Secondary | ICD-10-CM | POA: Diagnosis not present

## 2019-06-14 DIAGNOSIS — R0602 Shortness of breath: Secondary | ICD-10-CM | POA: Diagnosis not present

## 2019-06-14 DIAGNOSIS — J9601 Acute respiratory failure with hypoxia: Secondary | ICD-10-CM | POA: Diagnosis not present

## 2019-06-14 DIAGNOSIS — J1289 Other viral pneumonia: Secondary | ICD-10-CM | POA: Diagnosis not present

## 2019-06-14 DIAGNOSIS — Z6837 Body mass index (BMI) 37.0-37.9, adult: Secondary | ICD-10-CM | POA: Diagnosis not present

## 2019-06-14 DIAGNOSIS — T380X5A Adverse effect of glucocorticoids and synthetic analogues, initial encounter: Secondary | ICD-10-CM | POA: Diagnosis not present

## 2019-06-14 DIAGNOSIS — J4520 Mild intermittent asthma, uncomplicated: Secondary | ICD-10-CM | POA: Diagnosis not present

## 2019-06-14 DIAGNOSIS — Z887 Allergy status to serum and vaccine status: Secondary | ICD-10-CM | POA: Diagnosis not present

## 2019-06-14 DIAGNOSIS — R791 Abnormal coagulation profile: Secondary | ICD-10-CM | POA: Diagnosis not present

## 2019-06-14 DIAGNOSIS — J1282 Pneumonia due to coronavirus disease 2019: Secondary | ICD-10-CM

## 2019-06-14 DIAGNOSIS — Z881 Allergy status to other antibiotic agents status: Secondary | ICD-10-CM | POA: Diagnosis not present

## 2019-06-14 DIAGNOSIS — R0902 Hypoxemia: Secondary | ICD-10-CM | POA: Diagnosis not present

## 2019-06-14 DIAGNOSIS — D72819 Decreased white blood cell count, unspecified: Secondary | ICD-10-CM | POA: Diagnosis not present

## 2019-06-14 DIAGNOSIS — E669 Obesity, unspecified: Secondary | ICD-10-CM | POA: Diagnosis not present

## 2019-06-14 DIAGNOSIS — J45909 Unspecified asthma, uncomplicated: Secondary | ICD-10-CM | POA: Diagnosis not present

## 2019-06-14 DIAGNOSIS — U071 COVID-19: Secondary | ICD-10-CM

## 2019-06-14 HISTORY — DX: Pneumonia due to coronavirus disease 2019: J12.82

## 2019-06-14 HISTORY — DX: COVID-19: U07.1

## 2019-06-23 DIAGNOSIS — U071 COVID-19: Secondary | ICD-10-CM | POA: Diagnosis not present

## 2019-06-23 DIAGNOSIS — Z7689 Persons encountering health services in other specified circumstances: Secondary | ICD-10-CM | POA: Diagnosis not present

## 2019-06-23 DIAGNOSIS — J1289 Other viral pneumonia: Secondary | ICD-10-CM | POA: Diagnosis not present

## 2019-06-23 DIAGNOSIS — Z09 Encounter for follow-up examination after completed treatment for conditions other than malignant neoplasm: Secondary | ICD-10-CM | POA: Diagnosis not present

## 2019-06-23 DIAGNOSIS — J4520 Mild intermittent asthma, uncomplicated: Secondary | ICD-10-CM | POA: Diagnosis not present

## 2019-07-10 DIAGNOSIS — I6529 Occlusion and stenosis of unspecified carotid artery: Secondary | ICD-10-CM

## 2019-07-10 HISTORY — DX: Occlusion and stenosis of unspecified carotid artery: I65.29

## 2019-07-15 DIAGNOSIS — J4520 Mild intermittent asthma, uncomplicated: Secondary | ICD-10-CM | POA: Diagnosis not present

## 2019-07-15 DIAGNOSIS — R351 Nocturia: Secondary | ICD-10-CM | POA: Diagnosis not present

## 2019-07-15 DIAGNOSIS — R7989 Other specified abnormal findings of blood chemistry: Secondary | ICD-10-CM | POA: Diagnosis not present

## 2019-07-15 DIAGNOSIS — E669 Obesity, unspecified: Secondary | ICD-10-CM | POA: Diagnosis not present

## 2019-10-06 DIAGNOSIS — Z1231 Encounter for screening mammogram for malignant neoplasm of breast: Secondary | ICD-10-CM | POA: Diagnosis not present

## 2019-10-20 DIAGNOSIS — R5381 Other malaise: Secondary | ICD-10-CM

## 2019-10-20 DIAGNOSIS — Z87898 Personal history of other specified conditions: Secondary | ICD-10-CM | POA: Insufficient documentation

## 2019-10-20 DIAGNOSIS — E782 Mixed hyperlipidemia: Secondary | ICD-10-CM | POA: Insufficient documentation

## 2019-10-20 DIAGNOSIS — J4540 Moderate persistent asthma, uncomplicated: Secondary | ICD-10-CM | POA: Insufficient documentation

## 2019-10-20 DIAGNOSIS — R5383 Other fatigue: Secondary | ICD-10-CM | POA: Diagnosis not present

## 2019-10-20 DIAGNOSIS — Z8616 Personal history of COVID-19: Secondary | ICD-10-CM

## 2019-10-20 DIAGNOSIS — M25511 Pain in right shoulder: Secondary | ICD-10-CM | POA: Diagnosis not present

## 2019-10-20 DIAGNOSIS — M549 Dorsalgia, unspecified: Secondary | ICD-10-CM | POA: Diagnosis not present

## 2019-10-20 HISTORY — DX: Other malaise: R53.81

## 2019-10-20 HISTORY — DX: Personal history of COVID-19: Z86.16

## 2019-10-20 HISTORY — DX: Other fatigue: R53.83

## 2019-10-20 HISTORY — DX: Mixed hyperlipidemia: E78.2

## 2019-10-20 HISTORY — DX: Moderate persistent asthma, uncomplicated: J45.40

## 2019-10-20 HISTORY — DX: Personal history of other specified conditions: Z87.898

## 2019-10-26 ENCOUNTER — Ambulatory Visit: Payer: BC Managed Care – PPO | Admitting: Cardiology

## 2019-10-26 ENCOUNTER — Other Ambulatory Visit: Payer: Self-pay

## 2019-10-26 ENCOUNTER — Encounter: Payer: Self-pay | Admitting: Cardiology

## 2019-10-26 VITALS — BP 132/82 | HR 88 | Temp 97.4°F | Ht 64.0 in | Wt 230.6 lb

## 2019-10-26 DIAGNOSIS — R0989 Other specified symptoms and signs involving the circulatory and respiratory systems: Secondary | ICD-10-CM

## 2019-10-26 DIAGNOSIS — R0600 Dyspnea, unspecified: Secondary | ICD-10-CM | POA: Diagnosis not present

## 2019-10-26 DIAGNOSIS — R0789 Other chest pain: Secondary | ICD-10-CM | POA: Diagnosis not present

## 2019-10-26 DIAGNOSIS — R0609 Other forms of dyspnea: Secondary | ICD-10-CM

## 2019-10-26 HISTORY — DX: Dyspnea, unspecified: R06.00

## 2019-10-26 HISTORY — DX: Other forms of dyspnea: R06.09

## 2019-10-26 HISTORY — DX: Other specified symptoms and signs involving the circulatory and respiratory systems: R09.89

## 2019-10-26 NOTE — Progress Notes (Signed)
Cardiology Office Note:    Date:  10/26/2019   ID:  Karen Vargas, DOB Sep 07, 1970, MRN [id number]  PCP:  Patient, No Pcp Per  Cardiologist:  Garwin Brothers, MD   Referring MD: No ref. provider found    ASSESSMENT:    1. Chest tightness   2. DOE (dyspnea on exertion)   3. Morbid obesity (HCC)   4. Bilateral carotid bruits    PLAN:    In order of problems listed above:  1. Primary prevention stressed with the patient.  Importance of compliance with diet and medication stressed and he vocalized understanding.  Her blood pressure is stable. 2. Dyspnea on exertion: The symptoms are concerning and we will do a Lexiscan sestamibi to assess for any objective evidence of coronary artery disease.  Echocardiogram will be done to assess murmur heard on auscultation.  3.  Cardiac murmur as discussed above. 4. Morbid obesity: Diet was discussed risks of obesity explained and she vocalized understanding.  She knows to go to the nearest emergency room for any concerning symptoms.  Patient will be seen in follow-up appointment in 3 months or earlier if the patient has any concerns    Medication Adjustments/Labs and Tests Ordered: Current medicines are reviewed at length with the patient today.  Concerns regarding medicines are outlined above.  Orders Placed This Encounter  Procedures  . MYOCARDIAL PERFUSION IMAGING  . ECHOCARDIOGRAM COMPLETE   No orders of the defined types were placed in this encounter.    No chief complaint on file.    History of Present Illness:    Karen Vargas is a 49 y.o. female.  Patient has past medical history of COVID-19 infection.  She mentions to me that she has dyspnea on exertion.  She was supposed to have undergone stress test and echocardiogram last year but this was postponed.  She continues to have the symptoms.  She leads a sedentary lifestyle and is morbidly obese.  At the time of my evaluation, the patient is alert awake oriented and in no  distress.  Past Medical History:  Diagnosis Date  . Asthma   . Cardiac murmur   . Fibromyalgia   . Osteoporosis     Past Surgical History:  Procedure Laterality Date  . ABDOMINAL HYSTERECTOMY    . APPENDECTOMY    . CHOLECYSTECTOMY    . HAND SURGERY Right   . KNEE SURGERY      Current Medications: Current Meds  Medication Sig  . acetaminophen (TYLENOL) 500 MG tablet Take 500 mg by mouth every 6 (six) hours as needed.  Marland Kitchen albuterol (VENTOLIN HFA) 108 (90 Base) MCG/ACT inhaler albuterol sulfate HFA 90 mcg/actuation aerosol inhaler  . aspirin 81 MG chewable tablet Chew 81 mg by mouth daily.  Marland Kitchen loratadine (CLARITIN) 10 MG tablet Take 10 mg by mouth daily.     Allergies:   Ciprofloxacin, Doxycycline, Gabapentin, Influenza vaccines, Prednisone, and Tramadol   Social History   Socioeconomic History  . Marital status: Married    Spouse name: Not on file  . Number of children: Not on file  . Years of education: Not on file  . Highest education level: Not on file  Occupational History  . Not on file  Tobacco Use  . Smoking status: Former Games developer  . Smokeless tobacco: Never Used  Substance and Sexual Activity  . Alcohol use: Yes    Comment: occ  . Drug use: Never  . Sexual activity: Not on file  Other Topics  Concern  . Not on file  Social History Narrative  . Not on file   Social Determinants of Health   Financial Resource Strain:   . Difficulty of Paying Living Expenses:   Food Insecurity:   . Worried About Charity fundraiser in the Last Year:   . Arboriculturist in the Last Year:   Transportation Needs:   . Film/video editor (Medical):   Marland Kitchen Lack of Transportation (Non-Medical):   Physical Activity:   . Days of Exercise per Week:   . Minutes of Exercise per Session:   Stress:   . Feeling of Stress :   Social Connections:   . Frequency of Communication with Friends and Family:   . Frequency of Social Gatherings with Friends and Family:   . Attends  Religious Services:   . Active Member of Clubs or Organizations:   . Attends Archivist Meetings:   Marland Kitchen Marital Status:      Family History: The patient's family history is not on file.  ROS:   Please see the history of present illness.    All other systems reviewed and are negative.  EKGs/Labs/Other Studies Reviewed:    The following studies were reviewed today: EKG reveals sinus rhythm and nonspecific ST-T changes   Recent Labs: No results found for requested labs within last 8760 hours.  Recent Lipid Panel No results found for: CHOL, TRIG, HDL, CHOLHDL, VLDL, LDLCALC, LDLDIRECT  Physical Exam:    VS:  BP 132/82   Pulse 88   Temp (!) 97.4 F (36.3 C)   Ht 5\' 4"  (8.295 m)   Wt 230 lb 9.6 oz (104.6 kg)   SpO2 96%   BMI 39.58 kg/m     Wt Readings from Last 3 Encounters:  10/26/19 230 lb 9.6 oz (104.6 kg)  01/26/19 238 lb (108 kg)  10/29/17 240 lb (108.9 kg)     GEN: Patient is in no acute distress HEENT: Normal NECK: No JVD; bilateral soft carotid bruits LYMPHATICS: No lymphadenopathy CARDIAC: Hear sounds regular, 2/6 systolic murmur at the apex. RESPIRATORY:  Clear to auscultation without rales, wheezing or rhonchi  ABDOMEN: Soft, non-tender, non-distended MUSCULOSKELETAL:  No edema; No deformity  SKIN: Warm and dry NEUROLOGIC:  Alert and oriented x 3 PSYCHIATRIC:  Normal affect   Signed, Jenean Lindau, MD  10/26/2019 3:33 PM    [facility]

## 2019-10-26 NOTE — Patient Instructions (Addendum)
Medication Instructions:  No medication changes *If you need a refill on your cardiac medications before your next appointment, please call your pharmacy*   Lab Work: None ordered If you have labs (blood work) drawn today and your tests are completely normal, you will receive your results only by: Marland Kitchen MyChart Message (if you have MyChart) OR . A paper copy in the mail If you have any lab test that is abnormal or we need to change your treatment, we will call you to review the results.   Testing/Procedures: Your physician has requested that you have an echocardiogram. Echocardiography is a painless test that uses sound waves to create images of your heart. It provides your doctor with information about the size and shape of your heart and how well your heart's chambers and valves are working. This procedure takes approximately one hour. There are no restrictions for this procedure.  Your physician has requested that you have a lexiscan myoview. For further information please visit [email or web address]. Please follow instruction sheet, as given.  The test will take approximately 3 to 4 hours to complete; you may bring reading material.  If someone comes with you to your appointment, they will need to remain in the main lobby due to limited space in the testing area. **If you are pregnant or breastfeeding, please notify the nuclear lab prior to your appointment**  You will be having a 2 day study.  How to prepare for your Myocardial Perfusion Test: . Do not eat or drink 3 hours prior to your test, except you may have water. . Do not consume products containing caffeine (regular or decaffeinated) 12 hours prior to your test. (ex: coffee, chocolate, sodas, tea). . Do bring a list of your current medications with you.  If not listed below, you may take your medications as normal. . Do wear comfortable clothes (no dresses or overalls) and walking shoes, tennis shoes preferred (No heels or open toe  shoes are allowed). . Do NOT wear cologne, perfume, aftershave, or lotions (deodorant is allowed). . If these instructions are not followed, your test will have to be rescheduled. Your physician has requested that you have a carotid ultrasound. This test is an ultrasound of the carotid arteries in your neck. It looks at blood flow through these arteries that supply the brain with blood. Allow one hour for this exam. There are no restrictions or special instructions.  Your physician has requested that you have a carotid duplex. This test is an ultrasound of the carotid arteries in your neck. It looks at blood flow through these arteries that supply the brain with blood. Allow one hour for this exam. There are no restrictions or special instructions.       Follow-Up: At [facility], you and your health needs are our priority.  As part of our continuing mission to provide you with exceptional heart care, we have created designated Provider Care Teams.  These Care Teams include your primary Cardiologist (physician) and Advanced Practice Providers (APPs -  Physician Assistants and Nurse Practitioners) who all work together to provide you with the care you need, when you need it.  We recommend signing up for the patient portal called "MyChart".  Sign up information is provided on this After Visit Summary.  MyChart is used to connect with patients for Virtual Visits (Telemedicine).  Patients are able to view lab/test results, encounter notes, upcoming appointments, etc.  Non-urgent messages can be sent to your provider as well.   To learn  more about what you can do with MyChart, go to [email or web address].    Your next appointment:   3 month(s)  The format for your next appointment:   In Person  Provider:   Belva Crome, MD   Other Instructions  Echocardiogram An echocardiogram is a procedure that uses painless sound waves (ultrasound) to produce an image of the heart. Images from an  echocardiogram can provide important information about:  Signs of coronary artery disease (CAD).  Aneurysm detection. An aneurysm is a weak or damaged part of an artery wall that bulges out from the normal force of blood pumping through the body.  Heart size and shape. Changes in the size or shape of the heart can be associated with certain conditions, including heart failure, aneurysm, and CAD.  Heart muscle function.  Heart valve function.  Signs of a past heart attack.  Fluid buildup around the heart.  Thickening of the heart muscle.  A tumor or infectious growth around the heart valves. Tell a health care provider about:  Any allergies you have.  All medicines you are taking, including vitamins, herbs, eye drops, creams, and over-the-counter medicines.  Any blood disorders you have.  Any surgeries you have had.  Any medical conditions you have.  Whether you are pregnant or may be pregnant. What are the risks? Generally, this is a safe procedure. However, problems may occur, including:  Allergic reaction to dye (contrast) that may be used during the procedure. What happens before the procedure? No specific preparation is needed. You may eat and drink normally. What happens during the procedure?   An IV tube may be inserted into one of your veins.  You may receive contrast through this tube. A contrast is an injection that improves the quality of the pictures from your heart.  A gel will be applied to your chest.  A wand-like tool (transducer) will be moved over your chest. The gel will help to transmit the sound waves from the transducer.  The sound waves will harmlessly bounce off of your heart to allow the heart images to be captured in real-time motion. The images will be recorded on a computer. The procedure may vary among health care providers and hospitals. What happens after the procedure?  You may return to your normal, everyday life, including diet,  activities, and medicines, unless your health care provider tells you not to do that. Summary  An echocardiogram is a procedure that uses painless sound waves (ultrasound) to produce an image of the heart.  Images from an echocardiogram can provide important information about the size and shape of your heart, heart muscle function, heart valve function, and fluid buildup around your heart.  You do not need to do anything to prepare before this procedure. You may eat and drink normally.  After the echocardiogram is completed, you may return to your normal, everyday life, unless your health care provider tells you not to do that. This information is not intended to replace advice given to you by your health care provider. Make sure you discuss any questions you have with your health care provider. Document Revised: 10/16/2018 Document Reviewed: 07/28/2016 Elsevier Patient Education  2020 [employer].

## 2019-11-23 ENCOUNTER — Other Ambulatory Visit: Payer: BC Managed Care – PPO

## 2019-11-25 DIAGNOSIS — J4540 Moderate persistent asthma, uncomplicated: Secondary | ICD-10-CM | POA: Diagnosis not present

## 2019-11-25 DIAGNOSIS — Z8616 Personal history of COVID-19: Secondary | ICD-10-CM | POA: Diagnosis not present

## 2019-11-30 ENCOUNTER — Ambulatory Visit (INDEPENDENT_AMBULATORY_CARE_PROVIDER_SITE_OTHER): Payer: BC Managed Care – PPO

## 2019-11-30 ENCOUNTER — Other Ambulatory Visit: Payer: Self-pay

## 2019-11-30 DIAGNOSIS — R0600 Dyspnea, unspecified: Secondary | ICD-10-CM

## 2019-11-30 DIAGNOSIS — R0789 Other chest pain: Secondary | ICD-10-CM

## 2019-11-30 DIAGNOSIS — R0989 Other specified symptoms and signs involving the circulatory and respiratory systems: Secondary | ICD-10-CM

## 2019-11-30 NOTE — Progress Notes (Signed)
Complete echocardiogram performed.  Jimmy Vernel Langenderfer RDCS, RVT  

## 2019-11-30 NOTE — Progress Notes (Signed)
Carotid duplex exam has been performed.  Jimmy Chalisa Kobler RDCS, RVT 

## 2020-01-07 DIAGNOSIS — E538 Deficiency of other specified B group vitamins: Secondary | ICD-10-CM | POA: Diagnosis not present

## 2020-01-12 ENCOUNTER — Telehealth: Payer: Self-pay | Admitting: *Deleted

## 2020-01-12 NOTE — Telephone Encounter (Signed)
Patient given detailed instructions per Myocardial Perfusion Study Information Sheet for the test on 01/19/2020 at 1100. Patient notified to arrive 15 minutes early and that it is imperative to arrive on time for appointment to keep from having the test rescheduled.  If you need to cancel or reschedule your appointment, please call the office within 24 hours of your appointment. . Patient verbalized understanding.Audon Heymann, Adelene Idler No mychart available

## 2020-01-19 ENCOUNTER — Other Ambulatory Visit: Payer: Self-pay

## 2020-01-19 ENCOUNTER — Ambulatory Visit (INDEPENDENT_AMBULATORY_CARE_PROVIDER_SITE_OTHER): Payer: BC Managed Care – PPO

## 2020-01-19 DIAGNOSIS — R0789 Other chest pain: Secondary | ICD-10-CM

## 2020-01-19 DIAGNOSIS — R0600 Dyspnea, unspecified: Secondary | ICD-10-CM

## 2020-01-19 MED ORDER — TECHNETIUM TC 99M TETROFOSMIN IV KIT
30.6000 | PACK | Freq: Once | INTRAVENOUS | Status: AC | PRN
  Administered 2020-01-19: 30.6 via INTRAVENOUS

## 2020-01-19 MED ORDER — REGADENOSON 0.4 MG/5ML IV SOLN
0.4000 mg | Freq: Once | INTRAVENOUS | Status: AC
  Administered 2020-01-19: 0.4 mg via INTRAVENOUS

## 2020-01-20 ENCOUNTER — Ambulatory Visit: Payer: BC Managed Care – PPO

## 2020-01-20 LAB — MYOCARDIAL PERFUSION IMAGING
LV dias vol: 73 mL (ref 46–106)
LV sys vol: 26 mL
Peak HR: 105 {beats}/min
Rest HR: 80 {beats}/min
SDS: 5
SRS: 1
SSS: 6
TID: 0.94

## 2020-01-20 MED ORDER — TECHNETIUM TC 99M TETROFOSMIN IV KIT
32.2000 | PACK | Freq: Once | INTRAVENOUS | Status: AC | PRN
  Administered 2020-01-20: 32.2 via INTRAVENOUS

## 2020-01-27 ENCOUNTER — Other Ambulatory Visit: Payer: Self-pay

## 2020-01-27 DIAGNOSIS — E669 Obesity, unspecified: Secondary | ICD-10-CM

## 2020-01-27 DIAGNOSIS — J45909 Unspecified asthma, uncomplicated: Secondary | ICD-10-CM | POA: Insufficient documentation

## 2020-01-27 HISTORY — DX: Obesity, unspecified: E66.9

## 2020-02-08 ENCOUNTER — Encounter: Payer: Self-pay | Admitting: Cardiology

## 2020-02-08 ENCOUNTER — Other Ambulatory Visit: Payer: Self-pay

## 2020-02-08 ENCOUNTER — Ambulatory Visit: Payer: BC Managed Care – PPO | Admitting: Cardiology

## 2020-02-08 VITALS — BP 106/66 | HR 79 | Ht 64.0 in | Wt 248.0 lb

## 2020-02-08 DIAGNOSIS — R0600 Dyspnea, unspecified: Secondary | ICD-10-CM | POA: Diagnosis not present

## 2020-02-08 DIAGNOSIS — R0609 Other forms of dyspnea: Secondary | ICD-10-CM

## 2020-02-08 DIAGNOSIS — E782 Mixed hyperlipidemia: Secondary | ICD-10-CM | POA: Diagnosis not present

## 2020-02-08 NOTE — Progress Notes (Signed)
Cardiology Office Note:    Date:  02/08/2020   ID:  Karen Vargas, DOB Jun 20, 1971, MRN [id number]  PCP:  Gordan Payment., MD  Cardiologist:  Garwin Brothers, MD   Referring MD: Gordan Payment., MD    ASSESSMENT:    1. Mixed hyperlipidemia   2. DOE (dyspnea on exertion)   3. Morbid obesity (HCC)    PLAN:    In order of problems listed above:  1. Primary prevention stressed with the patient.  Importance of compliance with diet medication stressed and she vocalized understanding. 2. Results of stress testing and echocardiogram and carotid Dopplers discussed with the patient at extensive length and questions were answered to her satisfaction. 3. Mixed dyslipidemia: Diet was emphasized she will have blood work when she comes back before her next appointment in 6 months.  She promises to do better with diet and exercise 4. Morbid obesity: Weight loss discussed.  She plans to do better with diet and will revisit this in 6 months.  Patient had multiple questions which were answered to her satisfaction.   Medication Adjustments/Labs and Tests Ordered: Current medicines are reviewed at length with the patient today.  Concerns regarding medicines are outlined above.  No orders of the defined types were placed in this encounter.  No orders of the defined types were placed in this encounter.    No chief complaint on file.    History of Present Illness:    Karen Vargas is a 49 y.o. female.  Patient has past medical history of mixed dyslipidemia and bilateral mild atherosclerotic vascular disease of the carotid arteries.  She denies any problems at this time and takes care of activities of daily living.  She leads a sedentary lifestyle.  She tells me that she is recovering from her Covid pneumonia and is trying to get her energy level back.  At the time of my evaluation, the patient is alert awake oriented and in no distress.  Past Medical History:  Diagnosis Date  . Asthma   . Cardiac  murmur   . Fibromyalgia   . Osteoporosis     Past Surgical History:  Procedure Laterality Date  . ABDOMINAL HYSTERECTOMY    . APPENDECTOMY    . CHOLECYSTECTOMY    . HAND SURGERY Right   . KNEE SURGERY      Current Medications: Current Meds  Medication Sig  . acetaminophen (TYLENOL) 500 MG tablet Take 500 mg by mouth every 6 (six) hours as needed.  Marland Kitchen albuterol (VENTOLIN HFA) 108 (90 Base) MCG/ACT inhaler albuterol sulfate HFA 90 mcg/actuation aerosol inhaler  . cyanocobalamin (,VITAMIN B-12,) 1000 MCG/ML injection Inject 1 mL into the muscle every 30 (thirty) days.  Marland Kitchen loratadine (CLARITIN) 10 MG tablet Take 10 mg by mouth daily.     Allergies:   Ciprofloxacin, Doxycycline, Gabapentin, Influenza vaccines, Prednisone, and Tramadol   Social History   Socioeconomic History  . Marital status: Married    Spouse name: Not on file  . Number of children: Not on file  . Years of education: Not on file  . Highest education level: Not on file  Occupational History  . Not on file  Tobacco Use  . Smoking status: Former Games developer  . Smokeless tobacco: Never Used  Substance and Sexual Activity  . Alcohol use: Yes    Comment: occ  . Drug use: Never  . Sexual activity: Not on file  Other Topics Concern  . Not on file  Social History  Narrative  . Not on file   Social Determinants of Health   Financial Resource Strain:   . Difficulty of Paying Living Expenses:   Food Insecurity:   . Worried About Programme researcher, broadcasting/film/video in the Last Year:   . Barista in the Last Year:   Transportation Needs:   . Freight forwarder (Medical):   Marland Kitchen Lack of Transportation (Non-Medical):   Physical Activity:   . Days of Exercise per Week:   . Minutes of Exercise per Session:   Stress:   . Feeling of Stress :   Social Connections:   . Frequency of Communication with Friends and Family:   . Frequency of Social Gatherings with Friends and Family:   . Attends Religious Services:   . Active  Member of Clubs or Organizations:   . Attends Banker Meetings:   Marland Kitchen Marital Status:      Family History: The patient's family history is not on file.  ROS:   Please see the history of present illness.    All other systems reviewed and are negative.  EKGs/Labs/Other Studies Reviewed:    The following studies were reviewed today: Study Highlights   The left ventricular ejection fraction is normal (55-65%).  Nuclear stress EF: 64%.  There was no ST segment deviation noted during stress.  No T wave inversion was noted during stress.  Defect 1: There is a small fixed defect of mild severity present in the apical anterior location, with normal wall motion.  The study is normal.  This is a low risk study. The fixed defect as stated above is likely a soft attentuation.   IMPRESSIONS    1. Left ventricular ejection fraction, by estimation, is 60 to 65%. The  left ventricle has normal function. The left ventricle has no regional  wall motion abnormalities. Left ventricular diastolic parameters are  consistent with Grade I diastolic  dysfunction (impaired relaxation).   Summary:  Right Carotid: Velocities in the right ICA are consistent with a 1-39%  stenosis.         The ECA appears <50% stenosed.   Left Carotid: Velocities in the left ICA are consistent with a 1-39%  stenosis.        The ECA appears <50% stenosed.   Vertebrals: Bilateral vertebral arteries demonstrate antegrade flow.  Subclavians: Normal flow hemodynamics were seen in bilateral subclavian        arteries.   *See table(s) above for measurements and observations.      Electronically signed by Gypsy Balsam MD on 12/01/2019 at 11:06:59 AM.     Recent Labs: No results found for requested labs within last 8760 hours.  Recent Lipid Panel No results found for: CHOL, TRIG, HDL, CHOLHDL, VLDL, LDLCALC, LDLDIRECT  Physical Exam:    VS:  BP 106/66   Pulse 79    Ht 5\' 4"  (1.626 m)   Wt 248 lb (112.5 kg)   SpO2 96%   BMI 42.57 kg/m     Wt Readings from Last 3 Encounters:  02/08/20 248 lb (112.5 kg)  01/19/20 230 lb (104.3 kg)  10/26/19 230 lb 9.6 oz (104.6 kg)     GEN: Patient is in no acute distress HEENT: Normal NECK: No JVD; No carotid bruits LYMPHATICS: No lymphadenopathy CARDIAC: Hear sounds regular, 2/6 systolic murmur at the apex. RESPIRATORY:  Clear to auscultation without rales, wheezing or rhonchi  ABDOMEN: Soft, non-tender, non-distended MUSCULOSKELETAL:  No edema; No deformity  SKIN: Warm  and dry NEUROLOGIC:  Alert and oriented x 3 PSYCHIATRIC:  Normal affect   Signed, Garwin Brothers, MD  02/08/2020 4:51 PM     [facility]

## 2020-02-08 NOTE — Patient Instructions (Signed)
Medication Instructions:  No medication changes. *If you need a refill on your cardiac medications before your next appointment, please call your pharmacy*   Lab Work: Your physician recommends that you return for lab work in: before your next visit. You need to have labs done when you are fasting.  You can come Monday through Friday 8:30 am to 12:00 pm and 1:15 to 4:30. You do not need to make an appointment as the order has already been placed. The labs you are going to have done are BMET, LFT and Lipids.  If you have labs (blood work) drawn today and your tests are completely normal, you will receive your results only by: MyChart Message (if you have MyChart) OR A paper copy in the mail If you have any lab test that is abnormal or we need to change your treatment, we will call you to review the results.   Testing/Procedures: None ordered   Follow-Up: At [facility], you and your health needs are our priority.  As part of our continuing mission to provide you with exceptional heart care, we have created designated Provider Care Teams.  These Care Teams include your primary Cardiologist (physician) and Advanced Practice Providers (APPs -  Physician Assistants and Nurse Practitioners) who all work together to provide you with the care you need, when you need it.  We recommend signing up for the patient portal called "MyChart".  Sign up information is provided on this After Visit Summary.  MyChart is used to connect with patients for Virtual Visits (Telemedicine).  Patients are able to view lab/test results, encounter notes, upcoming appointments, etc.  Non-urgent messages can be sent to your provider as well.   To learn more about what you can do with MyChart, go to [email or web address].    Your next appointment:   6 month(s)  The format for your next appointment:   In Person  Provider:   Rajan Revankar, MD   Other Instructions NA  

## 2020-02-12 ENCOUNTER — Ambulatory Visit: Payer: BC Managed Care – PPO | Admitting: Cardiology

## 2020-03-26 DIAGNOSIS — Z20828 Contact with and (suspected) exposure to other viral communicable diseases: Secondary | ICD-10-CM | POA: Diagnosis not present

## 2020-04-06 ENCOUNTER — Encounter: Payer: Self-pay | Admitting: Sports Medicine

## 2020-04-06 ENCOUNTER — Ambulatory Visit (INDEPENDENT_AMBULATORY_CARE_PROVIDER_SITE_OTHER): Payer: BC Managed Care – PPO | Admitting: Sports Medicine

## 2020-04-06 ENCOUNTER — Other Ambulatory Visit: Payer: Self-pay

## 2020-04-06 ENCOUNTER — Ambulatory Visit (INDEPENDENT_AMBULATORY_CARE_PROVIDER_SITE_OTHER): Payer: BC Managed Care – PPO

## 2020-04-06 DIAGNOSIS — M79672 Pain in left foot: Secondary | ICD-10-CM

## 2020-04-06 DIAGNOSIS — M79671 Pain in right foot: Secondary | ICD-10-CM

## 2020-04-06 DIAGNOSIS — M722 Plantar fascial fibromatosis: Secondary | ICD-10-CM

## 2020-04-06 DIAGNOSIS — M7730 Calcaneal spur, unspecified foot: Secondary | ICD-10-CM

## 2020-04-06 DIAGNOSIS — M216X2 Other acquired deformities of left foot: Secondary | ICD-10-CM

## 2020-04-06 DIAGNOSIS — M216X1 Other acquired deformities of right foot: Secondary | ICD-10-CM

## 2020-04-06 MED ORDER — TRIAMCINOLONE ACETONIDE 10 MG/ML IJ SUSP
10.0000 mg | Freq: Once | INTRAMUSCULAR | Status: AC
  Administered 2020-04-06: 10 mg

## 2020-04-06 NOTE — Progress Notes (Signed)
Subjective: Karen Vargas is a 49 y.o. female patient presents to office with complaint of moderate heel pain on the right>left. Patient admits to post static dyskinesia for 3-4 months in duration. Patient has treated this problem with topical rub, change in shoes and rest with no relief. Denies any other pedal complaints.   Review of Systems  All other systems reviewed and are negative.   Patient Active Problem List   Diagnosis Date Noted  . Asthma 01/27/2020  . Obesity (BMI 35.0-39.9 without comorbidity) 01/27/2020  . DOE (dyspnea on exertion) 10/26/2019  . Bilateral carotid bruits 10/26/2019  . History of prediabetes 10/20/2019  . Malaise and fatigue 10/20/2019  . Mixed hyperlipidemia 10/20/2019  . Moderate persistent asthma 10/20/2019  . Personal history of covid-19 10/20/2019  . Pneumonia due to COVID-19 virus 06/14/2019  . Preoperative cardiovascular examination 01/26/2019  . Chest tightness 01/26/2019  . Morbid obesity (HCC) 01/26/2019  . Carpal tunnel syndrome of right wrist 01/01/2019  . Strain of neck muscle 01/01/2019    Current Outpatient Medications on File Prior to Visit  Medication Sig Dispense Refill  . acetaminophen (TYLENOL) 500 MG tablet Take 500 mg by mouth every 6 (six) hours as needed.    Marland Kitchen albuterol (VENTOLIN HFA) 108 (90 Base) MCG/ACT inhaler albuterol sulfate HFA 90 mcg/actuation aerosol inhaler    . cyanocobalamin (,VITAMIN B-12,) 1000 MCG/ML injection Inject 1 mL into the muscle every 30 (thirty) days.    Marland Kitchen loratadine (CLARITIN) 10 MG tablet Take 10 mg by mouth daily.     No current facility-administered medications on file prior to visit.    Allergies  Allergen Reactions  . Ciprofloxacin Diarrhea    Pt stated strong antibiotics give her C-diff for months Pt stated strong antibiotics give her C-diff for months   . Doxycycline Nausea And Vomiting  . Gabapentin   . Influenza Vaccines Hives  . Prednisone   . Tramadol Rash     Objective: Physical Exam General: The patient is alert and oriented x3 in no acute distress.  Dermatology: Skin is warm, dry and supple bilateral lower extremities. Nails 1-10 are normal. There is no erythema, edema, no eccymosis, no open lesions present. Integument is otherwise unremarkable.  Vascular: Dorsalis Pedis pulse and Posterior Tibial pulse are 2/4 bilateral. Capillary fill time is immediate to all digits.  Neurological: Grossly intact to light touch with an achilles reflex of +2/5 bilateral.  Musculoskeletal: Tenderness to palpation at the medial calcaneal tubercale and through the insertion of the plantar fascia on the right>left foot. No pain with compression of calcaneus bilateral. No pain with tuning fork to calcaneus bilateral. No pain with calf compression bilateral. There is decreased Ankle joint range of motion bilateral. All other joints range of motion within normal limits bilateral. Strength 5/5 in all groups bilateral.   Gait: Unassisted, Antalgic avoid weight on heels  Xray, Right/Left foot:  Normal osseous mineralization. Joint spaces preserved. No fracture/dislocation/boney destruction. Calcaneal spur present with mild thickening of plantar fascia. No other soft tissue abnormalities or radiopaque foreign bodies.   Assessment and Plan: Problem List Items Addressed This Visit    None    Visit Diagnoses    Plantar fasciitis, bilateral    -  Primary   Relevant Medications   triamcinolone acetonide (KENALOG) 10 MG/ML injection 10 mg (Completed) (Start on 04/06/2020  7:30 PM)   Acquired equinus deformity of both feet       Calcaneal spur, unspecified laterality  Pain of both heels       Relevant Orders   DG Foot Complete Right   DG Foot Complete Left      -Complete examination performed.  -Xrays reviewed -Discussed with patient in detail the condition of plantar fasciitis, how this occurs and general treatment options. Explained both conservative and  surgical treatments.  -After oral consent and aseptic prep, injected a mixture containing 1 ml of 2%  plain lidocaine, 1 ml 0.5% plain marcaine, 0.5 ml of kenalog 10 and 0.5 ml of dexamethasone phosphate into left and right heel. Post-injection care discussed with patient.  -Patient unable to take oral steroid due to allergy or NSAIDs due to history of GI bleed -Recommended good supportive shoes and advised use of OTC heel lifts as dispensed - Explained in detail the use of the fascial brace for the right which was dispensed at today's visit. -Explained and dispensed to patient daily stretching exercises. -Recommend patient to ice affected area 1-2x daily. -Patient to return to office in 4 weeks for follow up or sooner if problems or questions arise.  Asencion Islam, DPM

## 2020-04-06 NOTE — Patient Instructions (Signed)
Plantar Fasciitis Rehab Ask your health care provider which exercises are safe for you. Do exercises exactly as told by your health care provider and adjust them as directed. It is normal to feel mild stretching, pulling, tightness, or discomfort as you do these exercises. Stop right away if you feel sudden pain or your pain gets worse. Do not begin these exercises until told by your health care provider. Stretching and range-of-motion exercises These exercises warm up your muscles and joints and improve the movement and flexibility of your foot. These exercises also help to relieve pain. Plantar fascia stretch  1. Sit with your left / right leg crossed over your opposite knee. 2. Hold your heel with one hand with that thumb near your arch. With your other hand, hold your toes and gently pull them back toward the top of your foot. You should feel a stretch on the bottom of your toes or your foot (plantar fascia) or both. 3. Hold this stretch for__________ seconds. 4. Slowly release your toes and return to the starting position. Repeat __________ times. Complete this exercise __________ times a day. Gastrocnemius stretch, standing This exercise is also called a calf (gastroc) stretch. It stretches the muscles in the back of the upper calf. 1. Stand with your hands against a wall. 2. Extend your left / right leg behind you, and bend your front knee slightly. 3. Keeping your heels on the floor and your back knee straight, shift your weight toward the wall. Do not arch your back. You should feel a gentle stretch in your upper left / right calf. 4. Hold this position for __________ seconds. Repeat __________ times. Complete this exercise __________ times a day. Soleus stretch, standing This exercise is also called a calf (soleus) stretch. It stretches the muscles in the back of the lower calf. 1. Stand with your hands against a wall. 2. Extend your left / right leg behind you, and bend your front  knee slightly. 3. Keeping your heels on the floor, bend your back knee and shift your weight slightly over your back leg. You should feel a gentle stretch deep in your lower calf. 4. Hold this position for __________ seconds. Repeat __________ times. Complete this exercise __________ times a day. Gastroc and soleus stretch, standing step This exercise stretches the muscles in the back of the lower leg. These muscles are in the upper calf (gastrocnemius) and the lower calf (soleus). 1. Stand with the ball of your left / right foot on a step. The ball of your foot is on the walking surface, right under your toes. 2. Keep your other foot firmly on the same step. 3. Hold on to the wall or a railing for balance. 4. Slowly lift your other foot, allowing your body weight to press your left / right heel down over the edge of the step. You should feel a stretch in your left / right calf. 5. Hold this position for __________ seconds. 6. Return both feet to the step. 7. Repeat this exercise with a slight bend in your left / right knee. Repeat __________ times with your left / right knee straight and __________ times with your left / right knee bent. Complete this exercise __________ times a day. Balance exercise This exercise builds your balance and strength control of your arch to help take pressure off your plantar fascia. Single leg stand If this exercise is too easy, you can try it with your eyes closed or while standing on a pillow. 1.   Without shoes, stand near a railing or in a doorway. You may hold on to the railing or door frame as needed. 2. Stand on your left / right foot. Keep your big toe down on the floor and try to keep your arch lifted. Do not let your foot roll inward. 3. Hold this position for __________ seconds. Repeat __________ times. Complete this exercise __________ times a day. This information is not intended to replace advice given to you by your health care provider. Make sure  you discuss any questions you have with your health care provider. Document Revised: 10/16/2018 Document Reviewed: 04/23/2018 Elsevier Patient Education  2020 [employer]  

## 2020-04-13 DIAGNOSIS — M722 Plantar fascial fibromatosis: Secondary | ICD-10-CM | POA: Diagnosis not present

## 2020-04-13 DIAGNOSIS — R300 Dysuria: Secondary | ICD-10-CM | POA: Diagnosis not present

## 2020-04-13 DIAGNOSIS — E782 Mixed hyperlipidemia: Secondary | ICD-10-CM | POA: Diagnosis not present

## 2020-04-13 DIAGNOSIS — Z87898 Personal history of other specified conditions: Secondary | ICD-10-CM | POA: Diagnosis not present

## 2020-04-13 DIAGNOSIS — R5383 Other fatigue: Secondary | ICD-10-CM | POA: Diagnosis not present

## 2020-04-13 DIAGNOSIS — R5381 Other malaise: Secondary | ICD-10-CM | POA: Diagnosis not present

## 2020-04-13 DIAGNOSIS — Z8639 Personal history of other endocrine, nutritional and metabolic disease: Secondary | ICD-10-CM | POA: Diagnosis not present

## 2020-05-04 ENCOUNTER — Other Ambulatory Visit: Payer: Self-pay

## 2020-05-04 ENCOUNTER — Ambulatory Visit: Payer: BC Managed Care – PPO | Admitting: Sports Medicine

## 2020-05-04 ENCOUNTER — Encounter: Payer: Self-pay | Admitting: Sports Medicine

## 2020-05-04 DIAGNOSIS — M722 Plantar fascial fibromatosis: Secondary | ICD-10-CM | POA: Diagnosis not present

## 2020-05-04 DIAGNOSIS — M7730 Calcaneal spur, unspecified foot: Secondary | ICD-10-CM

## 2020-05-04 DIAGNOSIS — M216X2 Other acquired deformities of left foot: Secondary | ICD-10-CM

## 2020-05-04 DIAGNOSIS — M216X1 Other acquired deformities of right foot: Secondary | ICD-10-CM | POA: Diagnosis not present

## 2020-05-04 DIAGNOSIS — M79671 Pain in right foot: Secondary | ICD-10-CM

## 2020-05-04 DIAGNOSIS — M79672 Pain in left foot: Secondary | ICD-10-CM

## 2020-05-04 NOTE — Progress Notes (Signed)
Subjective: Karen Vargas is a 49 y.o. female returns to office for follow up evaluation after Left and Right heel injection for plantar fasciitis, injection #1 administered 4 weeks ago. Patient states that the injection seems to help her pain; pain is 85% better but does reports that she had did a lot right after the injection and had a small increase of pain and PCP gave Rx for prednisone and only took a few tabs. Patient denies any recent changes in medications or new problems since last visit.   Patient Active Problem List   Diagnosis Date Noted  . Asthma 01/27/2020  . Obesity (BMI 35.0-39.9 without comorbidity) 01/27/2020  . DOE (dyspnea on exertion) 10/26/2019  . Bilateral carotid bruits 10/26/2019  . History of prediabetes 10/20/2019  . Malaise and fatigue 10/20/2019  . Mixed hyperlipidemia 10/20/2019  . Moderate persistent asthma 10/20/2019  . Personal history of COVID-19 10/20/2019  . Pneumonia due to COVID-19 virus 06/14/2019  . Preoperative cardiovascular examination 01/26/2019  . Chest tightness 01/26/2019  . Morbid obesity (HCC) 01/26/2019  . Carpal tunnel syndrome of right wrist 01/01/2019  . Strain of neck muscle 01/01/2019    Current Outpatient Medications on File Prior to Visit  Medication Sig Dispense Refill  . acetaminophen (TYLENOL) 500 MG tablet Take 500 mg by mouth every 6 (six) hours as needed.    Marland Kitchen albuterol (VENTOLIN HFA) 108 (90 Base) MCG/ACT inhaler albuterol sulfate HFA 90 mcg/actuation aerosol inhaler    . cyanocobalamin (,VITAMIN B-12,) 1000 MCG/ML injection Inject 1 mL into the muscle every 30 (thirty) days.    Marland Kitchen loratadine (CLARITIN) 10 MG tablet Take 10 mg by mouth daily.     No current facility-administered medications on file prior to visit.    Allergies  Allergen Reactions  . Ciprofloxacin Diarrhea    Pt stated strong antibiotics give her C-diff for months Pt stated strong antibiotics give her C-diff for months   . Doxycycline Nausea And  Vomiting  . Gabapentin   . Influenza Vaccines Hives  . Prednisone   . Tramadol Rash    Objective:   General:  Alert and oriented x 3, in no acute distress  Dermatology: Skin is warm, dry, and supple bilateral. Nails are within normal limits. There is no lower extremity erythema, no eccymosis, no open lesions present bilateral.   Vascular: Dorsalis Pedis and Posterior Tibial pedal pulses are intact bilateral.   Neurological: Sensation grossly intact to light touch bilateral.   Musculoskeletal: There is decreased tenderness to palpation at the medial calcaneal tubercale and through the insertion of the plantar fascia on the Left and right foot. No pain with compression to calcaneus or application of tuning fork. There is decreased Ankle joint range of motion bilateral. All other jointsrange of motion  within normal limits bilateral. Strength 5/5 bilateral.   Assessment and Plan: Problem List Items Addressed This Visit    None    Visit Diagnoses    Plantar fasciitis, bilateral    -  Primary   Acquired equinus deformity of both feet       Calcaneal spur, unspecified laterality       Pain of both heels          -Complete examination performed.  -Previous x-rays reviewed. -Discussed with patient in detail the condition of plantar fasciitis, how this  occurs related to the foot type of the patient and general treatment options. -Dispensed night splint -Continue with plantar fascial braces and heel lifts and once heels feel  better may try tennis shoes/more supportive shoes besides crocs -Continue with stretching and icing daily  -Discussed long term care and reocurrence; will closely monitor; if fails to improve will consider other treatment modalities.  -Patient to return to office in 1 month for follow up or sooner if problems or questions arise.  Asencion Islam, DPM

## 2020-05-04 NOTE — Patient Instructions (Signed)

## 2020-06-10 ENCOUNTER — Ambulatory Visit: Payer: BC Managed Care – PPO | Admitting: Sports Medicine

## 2020-07-20 DIAGNOSIS — Z7982 Long term (current) use of aspirin: Secondary | ICD-10-CM

## 2020-07-20 DIAGNOSIS — G8929 Other chronic pain: Secondary | ICD-10-CM

## 2020-07-20 DIAGNOSIS — E538 Deficiency of other specified B group vitamins: Secondary | ICD-10-CM

## 2020-07-20 DIAGNOSIS — M25562 Pain in left knee: Secondary | ICD-10-CM

## 2020-07-20 HISTORY — DX: Long term (current) use of aspirin: Z79.82

## 2020-07-20 HISTORY — DX: Pain in left knee: M25.562

## 2020-07-20 HISTORY — DX: Deficiency of other specified B group vitamins: E53.8

## 2020-07-20 HISTORY — DX: Other chronic pain: G89.29

## 2020-08-05 ENCOUNTER — Other Ambulatory Visit: Payer: Self-pay

## 2020-08-05 DIAGNOSIS — M797 Fibromyalgia: Secondary | ICD-10-CM | POA: Insufficient documentation

## 2020-08-05 DIAGNOSIS — M810 Age-related osteoporosis without current pathological fracture: Secondary | ICD-10-CM | POA: Insufficient documentation

## 2020-08-05 DIAGNOSIS — R011 Cardiac murmur, unspecified: Secondary | ICD-10-CM | POA: Insufficient documentation

## 2020-08-10 ENCOUNTER — Ambulatory Visit: Payer: BC Managed Care – PPO | Admitting: Cardiology

## 2020-09-28 ENCOUNTER — Ambulatory Visit: Payer: BC Managed Care – PPO | Admitting: Cardiology

## 2020-09-28 ENCOUNTER — Encounter: Payer: Self-pay | Admitting: Cardiology

## 2020-09-28 ENCOUNTER — Other Ambulatory Visit: Payer: Self-pay

## 2020-09-28 ENCOUNTER — Ambulatory Visit (INDEPENDENT_AMBULATORY_CARE_PROVIDER_SITE_OTHER): Payer: BC Managed Care – PPO

## 2020-09-28 DIAGNOSIS — R002 Palpitations: Secondary | ICD-10-CM

## 2020-09-28 DIAGNOSIS — E785 Hyperlipidemia, unspecified: Secondary | ICD-10-CM

## 2020-09-28 DIAGNOSIS — M1990 Unspecified osteoarthritis, unspecified site: Secondary | ICD-10-CM | POA: Insufficient documentation

## 2020-09-28 DIAGNOSIS — R7303 Prediabetes: Secondary | ICD-10-CM | POA: Insufficient documentation

## 2020-09-28 DIAGNOSIS — E559 Vitamin D deficiency, unspecified: Secondary | ICD-10-CM

## 2020-09-28 HISTORY — DX: Palpitations: R00.2

## 2020-09-28 HISTORY — DX: Vitamin D deficiency, unspecified: E55.9

## 2020-09-28 HISTORY — DX: Hyperlipidemia, unspecified: E78.5

## 2020-09-28 HISTORY — DX: Unspecified osteoarthritis, unspecified site: M19.90

## 2020-09-28 HISTORY — DX: Prediabetes: R73.03

## 2020-09-28 NOTE — Patient Instructions (Signed)
Medication Instructions:  No medication changes. *If you need a refill on your cardiac medications before your next appointment, please call your pharmacy*   Lab Work: None ordered If you have labs (blood work) drawn today and your tests are completely normal, you will receive your results only by: Marland Kitchen MyChart Message (if you have MyChart) OR . A paper copy in the mail If you have any lab test that is abnormal or we need to change your treatment, we will call you to review the results.   Testing/Procedures:  WHY IS MY DOCTOR PRESCRIBING ZIO? The Zio system is proven and trusted by physicians to detect and diagnose irregular heart rhythms -- and has been prescribed to hundreds of thousands of patients.  The FDA has cleared the Zio system to monitor for many different kinds of irregular heart rhythms. In a study, physicians were able to reach a diagnosis 90% of the time with the Zio system1.  You can wear the Zio monitor -- a small, discreet, comfortable patch -- during your normal day-to-day activity, including while you sleep, shower, and exercise, while it records every single heartbeat for analysis.  1Barrett, P., et al. Comparison of 24 Hour Holter Monitoring Versus 14 Day Novel Adhesive Patch Electrocardiographic Monitoring. American Journal of Medicine, 2014.  ZIO VS. HOLTER MONITORING The Zio monitor can be comfortably worn for up to 14 days. Holter monitors can be worn for 24 to 48 hours, limiting the time to record any irregular heart rhythms you may have. Zio is able to capture data for the 51% of patients who have their first symptom-triggered arrhythmia after 48 hours.1  LIVE WITHOUT RESTRICTIONS The Zio ambulatory cardiac monitor is a small, unobtrusive, and water-resistant patch--you might even forget you're wearing it. The Zio monitor records and stores every beat of your heart, whether you're sleeping, working out, or showering. Wear the monitor for 2 weeks, remove  10/05/20.   Follow-Up: At [facility], you and your health needs are our priority.  As part of our continuing mission to provide you with exceptional heart care, we have created designated Provider Care Teams.  These Care Teams include your primary Cardiologist (physician) and Advanced Practice Providers (APPs -  Physician Assistants and Nurse Practitioners) who all work together to provide you with the care you need, when you need it.  We recommend signing up for the patient portal called "MyChart".  Sign up information is provided on this After Visit Summary.  MyChart is used to connect with patients for Virtual Visits (Telemedicine).  Patients are able to view lab/test results, encounter notes, upcoming appointments, etc.  Non-urgent messages can be sent to your provider as well.   To learn more about what you can do with MyChart, go to [email or web address].    Your next appointment:   6 month(s)  The format for your next appointment:   In Person  Provider:   Belva Crome, MD   Other Instructions NA

## 2020-09-28 NOTE — Progress Notes (Signed)
Cardiology Office Note:    Date:  09/28/2020   ID:  Karen Vargas, DOB [birth year], MRN [id number]  PCP:  Gordan Payment., MD  Cardiologist:  Garwin Brothers, MD   Referring MD: Gordan Payment., MD    ASSESSMENT:    1. Morbid obesity (HCC)   2. Palpitations    PLAN:    In order of problems listed above:  1. Primary prevention stressed with the patient.  Importance of compliance with diet medication stressed and she vocalized understanding. 2. Palpitations: These are of concern to her.  Therefore we will do a 24-hour monitor.  Recent thyroid work-up blood work was unremarkable. 3. Mixed dyslipidemia: Mild in nature followed by primary care provider.  Diet emphasized. 4.    Medication Adjustments/Labs and Tests Ordered: Current medicines are reviewed at length with the patient today.  Concerns regarding medicines are outlined above.  No orders of the defined types were placed in this encounter.  No orders of the defined types were placed in this encounter.    No chief complaint on file.    History of Present Illness:    Karen Vargas is a 50 y.o. female.  Patient has past medical history of palpitations and morbid obesity.  She has rheumatoid arthritis.  She leads a sedentary lifestyle.  She denies any chest pain orthopnea or PND.  She complains of palpitations happening on a regular basis.  At the time of my evaluation, the patient is alert awake oriented and in no distress.  Past Medical History:  Diagnosis Date  . Asthma   . B12 deficiency 07/20/2020  . Bilateral carotid bruits 10/26/2019  . Cardiac murmur   . Carpal tunnel syndrome of right wrist 01/01/2019  . Chest tightness 01/26/2019  . Chronic arthralgias of knees and hips 07/20/2020  . DOE (dyspnea on exertion) 10/26/2019  . Fibromyalgia   . History of prediabetes 10/20/2019  . Long-term use of aspirin therapy 07/20/2020  . Malaise and fatigue 10/20/2019  . Mixed hyperlipidemia 10/20/2019  . Morbid obesity (HCC)  01/26/2019  . Obesity (BMI 35.0-39.9 without comorbidity) 01/27/2020  . Osteoporosis   . Personal history of COVID-19 10/20/2019   Formatting of this note might be different from the original. 06/14/19  . Preoperative cardiovascular examination 01/26/2019  . Strain of neck muscle 01/01/2019    Past Surgical History:  Procedure Laterality Date  . ABDOMINAL HYSTERECTOMY    . APPENDECTOMY    . CHOLECYSTECTOMY    . HAND SURGERY Right   . KNEE SURGERY      Current Medications: No outpatient medications have been marked as taking for the 09/28/20 encounter (Office Visit) with Revankar, Aundra Dubin, MD.     Allergies:   Ciprofloxacin, Amoxicillin-pot clavulanate, Doxycycline, Fluzone quadrivalent [influenza vac split quad], Gabapentin, Influenza vaccines, Meloxicam, Penicillin g, Prednisone, and Tramadol   Social History   Socioeconomic History  . Marital status: Married    Spouse name: Not on file  . Number of children: Not on file  . Years of education: Not on file  . Highest education level: Not on file  Occupational History  . Not on file  Tobacco Use  . Smoking status: Former Games developer  . Smokeless tobacco: Never Used  Substance and Sexual Activity  . Alcohol use: Yes    Comment: occ  . Drug use: Never  . Sexual activity: Not on file  Other Topics Concern  . Not on file  Social History Narrative  . Not on  file   Social Determinants of Health   Financial Resource Strain: Not on file  Food Insecurity: Not on file  Transportation Needs: Not on file  Physical Activity: Not on file  Stress: Not on file  Social Connections: Not on file     Family History: The patient's family history includes Cancer in her paternal aunt; Diabetes in her maternal grandmother and mother; Heart attack in her paternal grandfather and paternal grandmother; Hypertension in her father, mother, and sister; Stroke in her mother.  ROS:   Please see the history of present illness.    All other systems  reviewed and are negative.  EKGs/Labs/Other Studies Reviewed:    The following studies were reviewed today:  IMPRESSIONS:  1. Left ventricular ejection fraction, by estimation, is 60 to 65%. The  left ventricle has normal function. The left ventricle has no regional  wall motion abnormalities. Left ventricular diastolic parameters are  consistent with Grade I diastolic  dysfunction (impaired relaxation).   Study Highlights   The left ventricular ejection fraction is normal (55-65%).  Nuclear stress EF: 64%.  There was no ST segment deviation noted during stress.  No T wave inversion was noted during stress.  Defect 1: There is a small fixed defect of mild severity present in the apical anterior location, with normal wall motion.  The study is normal.  This is a low risk study. The fixed defect as stated above is likely a soft attentuation.     Recent Labs: No results found for requested labs within last 8760 hours.  Recent Lipid Panel No results found for: CHOL, TRIG, HDL, CHOLHDL, VLDL, LDLCALC, LDLDIRECT  Physical Exam:    VS:  BP 124/78   Pulse 75   Ht 5\' 4"  (1.626 m)   Wt 239 lb 3.2 oz (108.5 kg)   SpO2 98%   BMI 41.06 kg/m     Wt Readings from Last 3 Encounters:  09/28/20 239 lb 3.2 oz (108.5 kg)  02/08/20 248 lb (112.5 kg)  01/19/20 230 lb (104.3 kg)     GEN: Patient is in no acute distress HEENT: Normal NECK: No JVD; No carotid bruits LYMPHATICS: No lymphadenopathy CARDIAC: Hear sounds regular, 2/6 systolic murmur at the apex. RESPIRATORY:  Clear to auscultation without rales, wheezing or rhonchi  ABDOMEN: Soft, non-tender, non-distended MUSCULOSKELETAL:  No edema; No deformity  SKIN: Warm and dry NEUROLOGIC:  Alert and oriented x 3 PSYCHIATRIC:  Normal affect   Signed, 01/21/20, MD  09/28/2020 1:50 PM    [facility]

## 2020-10-04 ENCOUNTER — Ambulatory Visit: Payer: BC Managed Care – PPO | Admitting: Cardiology

## 2022-03-13 ENCOUNTER — Ambulatory Visit (INDEPENDENT_AMBULATORY_CARE_PROVIDER_SITE_OTHER): Payer: BC Managed Care – PPO | Admitting: Podiatry

## 2022-03-13 ENCOUNTER — Ambulatory Visit (INDEPENDENT_AMBULATORY_CARE_PROVIDER_SITE_OTHER): Payer: BC Managed Care – PPO

## 2022-03-13 DIAGNOSIS — R600 Localized edema: Secondary | ICD-10-CM | POA: Diagnosis not present

## 2022-03-13 DIAGNOSIS — M84375A Stress fracture, left foot, initial encounter for fracture: Secondary | ICD-10-CM

## 2022-03-13 DIAGNOSIS — M7732 Calcaneal spur, left foot: Secondary | ICD-10-CM | POA: Diagnosis not present

## 2022-03-13 DIAGNOSIS — M778 Other enthesopathies, not elsewhere classified: Secondary | ICD-10-CM

## 2022-03-13 DIAGNOSIS — M7730 Calcaneal spur, unspecified foot: Secondary | ICD-10-CM

## 2022-03-13 NOTE — Progress Notes (Signed)
  Subjective:  Patient ID: Karen Vargas, female    DOB: [birth year],  MRN: [id number]  Chief Complaint  Patient presents with   Foot Pain    Left foot pain, bone spur- icing the foot. Shooting pain from arch to the heel. Pain started at the top of left foot. Patient sees the rheumatologist.     51 y.o. female presents with the above complaint. History confirmed with patient. Patient presents to the office today with significant pain and swelling to the left dorsal forefoot. She denies injury or any sudden increase in activity level. She woke up one morning 2 weeks ago and had significant pain and swelling in the left forefoot. Hurts to move her ankle and cant move her 2nd and 3rd toe due to swelling. She has a history of inflammatory arthritis of unspecified type and osteoarthritis. See a rheumatologist who took XR nd referred her to this office. Has tried taking famotidine for this pain but not really helping.   Objective:  Physical Exam: warm, good capillary refill, no trophic changes or ulcerative lesions, normal DP and PT pulses, and normal sensory exam. Left Foot: soft tissue swelling noted over the dorsal forefoot especially mid 2nd and 3rd metatarsal and focal pain with pal pation along the 2nd and 3rd metatarsal.  Right Foot: normal exam, no swelling, tenderness, instability; ligaments intact, full range of motion of all ankle/foot joints  No images are attached to the encounter.  Radiographs: Multiple views x-ray of the left foot: no fracture, dislocation, swelling or degenerative changes noted, soft tissue swelling of the left forefoot noted.  Assessment:   1. Calcaneal spur, unspecified laterality   2. Extensor tendinitis of foot   3. Edema of left foot   4. Stress reaction of left foot, initial encounter      Plan:  Patient was evaluated and treated and all questions answered.  Extensor tendinitis of left foot Discussed I believe the patients left foot pain and edema is  related to extensor tendinitis. Recommend we proceed with steroid injection to decrease swelling and pain in the dorsal central forefoot. I proceeded with steroid injection of 1 cc 0.5%marcaine plain with 1 cc kenalod 10. Following skin prep with alcohol prep pad I injected the mixture into the dorsal central forefoot in area of maximum tenderness along the 2nd and 3rd extensor digitorum longus tendons. Recommend the patient rest her left foot as much as possible decrease weightbearing over next 2 weeks. Rest ice and compress the area daily. Elevate extremity to decrease edema. Follow up in 2 weeks for recheck. If not improving will consider MRI to evaluate for bone stress reaction.    Return in about 2 weeks (around 03/27/2022) for F/u left foot ext tendinitis .

## 2022-04-03 ENCOUNTER — Ambulatory Visit: Payer: Self-pay | Admitting: Podiatry

## 2022-05-01 ENCOUNTER — Ambulatory Visit: Payer: Self-pay | Admitting: Podiatry

## 2022-05-01 DIAGNOSIS — R600 Localized edema: Secondary | ICD-10-CM

## 2022-05-01 DIAGNOSIS — M778 Other enthesopathies, not elsewhere classified: Secondary | ICD-10-CM

## 2022-05-01 NOTE — Progress Notes (Signed)
  Subjective:  Patient ID: Karen Vargas, female    DOB: August 15, 1970,  MRN: [id number]  Chief Complaint  Patient presents with   Follow-up    2 week for follow up left foot extensor tendinitis.  2nd toe to left foot if patient pulls toe toward the ball of foot she feels pulling sensation.     51 y.o. female presents with the above complaint. History confirmed with patient. Patient presents to the office today with follow up of left foot extensor tendinitis, s/p L foot steroid injection along EDL tendon at last visit.  She notes that she has had significant relief of pain in the left foot after this injection.  She is still having some pain in the second MPJ.  Pain is dorsally and increased with flexion of the second digit.  Overall this is decreased from prior.  Objective:  Physical Exam: warm, good capillary refill, no trophic changes or ulcerative lesions, normal DP and PT pulses, and normal sensory exam. Left Foot: Decreased pain in the left dorsal foot along course of extensor digitorum longus tendon. Pain in left 2nd MPJ increased with toe flexion.  Right Foot: normal exam, no swelling, tenderness, instability; ligaments intact, full range of motion of all ankle/foot joints  No images are attached to the encounter.  Radiographs: Multiple views x-ray of the left foot : no fracture, dislocation, swelling or degenerative changes noted, soft tissue swelling of the left forefoot noted.  Assessment:   1. Extensor tendinitis of foot   2. Edema of left foot     Plan:  Patient was evaluated and treated and all questions answered.  #Extensor tendinitis left foot, second MPJ capsulitis - Patient's pain is overall resolved after steroid injection #1 at last visit. -Recommend Voltaren gel for pain at the second metatarsophalangeal joint dorsally. -Discussed that she may do no further steroid injection in the future if the pain worsens. -Recommend she continue to stretch daily ice use  ibuprofen or anti-inflammatory medication as needed.  Recommend she use supportive inserts and good supportive shoe gear at all times. -Patient will follow-up in 2 months for recheck and determine if she needs any further steroid injections at that time.    Return in about 2 months (around 07/01/2022) for Follow up left 2nd MPJ pain/ capsulitis.         Everitt Amber, DPM [facility]                   05/01/2022

## 2022-05-21 ENCOUNTER — Other Ambulatory Visit: Payer: Self-pay | Admitting: Specialist

## 2022-05-21 DIAGNOSIS — H547 Unspecified visual loss: Secondary | ICD-10-CM

## 2022-05-21 DIAGNOSIS — M542 Cervicalgia: Secondary | ICD-10-CM

## 2022-05-21 DIAGNOSIS — Z8669 Personal history of other diseases of the nervous system and sense organs: Secondary | ICD-10-CM

## 2022-06-13 ENCOUNTER — Ambulatory Visit
Admission: RE | Admit: 2022-06-13 | Discharge: 2022-06-13 | Disposition: A | Discharge status: Home / Self Care | Payer: BC Managed Care – PPO | Source: Ambulatory Visit | Attending: Specialist | Admitting: Specialist

## 2022-06-13 ENCOUNTER — Other Ambulatory Visit: Payer: Self-pay | Admitting: Specialist

## 2022-06-13 DIAGNOSIS — Z8669 Personal history of other diseases of the nervous system and sense organs: Secondary | ICD-10-CM

## 2022-06-13 DIAGNOSIS — G43909 Migraine, unspecified, not intractable, without status migrainosus: Secondary | ICD-10-CM

## 2022-06-13 DIAGNOSIS — H547 Unspecified visual loss: Secondary | ICD-10-CM

## 2022-06-13 DIAGNOSIS — M542 Cervicalgia: Secondary | ICD-10-CM

## 2022-06-13 MED ORDER — GADOPICLENOL 0.5 MMOL/ML IV SOLN
10.0000 mL | Freq: Once | INTRAVENOUS | Status: AC | PRN
  Administered 2022-06-13: 10 mL via INTRAVENOUS

## 2022-07-09 DIAGNOSIS — I2510 Atherosclerotic heart disease of native coronary artery without angina pectoris: Secondary | ICD-10-CM

## 2022-07-09 HISTORY — DX: Atherosclerotic heart disease of native coronary artery without angina pectoris: I25.10

## 2023-08-04 ENCOUNTER — Encounter: Payer: Self-pay | Admitting: Cardiology

## 2023-08-04 NOTE — Progress Notes (Signed)
 " Cardiology Office Note:  .   Date:  08/05/2023  ID:  Karen Vargas, DOB June 24, 1971, MRN [id number] PCP: Karen Darice BROCKS, FNP  [facility] Providers Cardiologist:  Karen Vargas Crape, MD    History of Present Illness: .   Karen Vargas is a 53 y.o. female with a past medical history of coronary artery disease noted on CT imaging, asthma, mild bilateral carotid artery stenosis, carpal tunnel syndrome, dyslipidemia, fibromyalgia, palpitations.  August 2024 CT of the abdomen >> Right coronary artery atheromatous vascular disease.  10/17/2020 monitor predominantly in sinus rhythm, rare VE's. 01/19/2020 Lexiscan  normal, low risk study 11/30/2019 echo EF 60 to 65%, grade 1 DD 11/30/2019 carotid duplex mild bilateral carotid artery stenosis  Most recently evaluated by Dr. Crape on 09/28/2020.  She was bothered by palpitations and a monitor was arranged which revealed rare VE, predominantly sinus rhythm.  She presents today to reestablish care, has multiple complaints.  Since she was last evaluated in our office she has been diagnosed with an autoimmune disorder. Today she is concerned with episodes of palpitations, chest swelling, and DOE.  Regarding her palpitations, her mother had atrial fibrillation, she is concerned that she could have this as well, episodes typically last for several minutes, not associated with any other symptoms.  Regarding her  chest swelling, this is been bothering her for a few weeks where she states she is continuingly had to let her brought out--tried to decipher if this was truly swelling or rather this sensation of swelling--she states that she has had to go up several bra sizes and by several extenders.  She denies any weight gain. She denies pnd, orthopnea, n, v, dizziness, syncope, edema, weight gain, or early satiety.    ROS: Review of Systems  Constitutional:  Positive for malaise/fatigue.  Respiratory:  Positive for shortness of breath.   Cardiovascular:   Positive for palpitations.       Chest swelling  All other systems reviewed and are negative.    Studies Reviewed: SABRA   EKG Interpretation Date/Time:  Monday August 05 2023 10:23:40 EST Ventricular Rate:  78 PR Interval:  154 QRS Duration:  80 QT Interval:  376 QTC Calculation: 428 R Axis:   -10  Text Interpretation: Normal sinus rhythm Low voltage QRS Cannot rule out Anterior infarct , age undetermined Abnormal ECG When compared with ECG of 29-Oct-2017 16:06, Minimal criteria for Anterior infarct are now Present Confirmed by Carlin Nest [phone]) on 08/05/2023 10:30:13 AM     Risk Assessment/Calculations:             Physical Exam:   VS:  BP 110/80   Pulse 78   Ht 5' 4.6 (1.641 m)   Wt 241 lb 3.2 oz (109.4 kg)   SpO2 99%   BMI 40.64 kg/m    Wt Readings from Last 3 Encounters:  08/05/23 241 lb 3.2 oz (109.4 kg)  09/28/20 239 lb 3.2 oz (108.5 kg)  02/08/20 248 lb (112.5 kg)    GEN: Well nourished, well developed in no acute distress NECK: No JVD; No carotid bruits CARDIAC: RRR, no murmurs, rubs, gallops RESPIRATORY:  Clear to auscultation without rales, wheezing or rhonchi  ABDOMEN: Soft, non-tender, non-distended EXTREMITIES:  No edema; No deformity   ASSESSMENT AND PLAN: .   CAD -right coronary artery disease noted incidentally on CT imaging.  States she is unable to take an aspirin, we discussed starting Plavix  however she is not agreeable to do that at this  time I would like to wait to see what further testing reveals.  She is having episodes that she describes as chest swelling, this with her known coronary artery disease could be an anginal equivalent will arrange for coronary CTA. Will arrange for NTG.   Hypertension-blood pressure is well-controlled at 110/80, continue Cozaar 25 mg daily.  Palpitations-will arrange for 2-week monitor.  DOE - will repeat echo for any changes. Likely multifactorial due to deconditioning and obesity.   Dyslipidemia-not  currently on any lipid-lowering agents, recent LFTs last week by PCP were normal, will check direct LDL today and LPA.  Carotid artery stenosis - mild per exam in 2021, will repeat carotid duplex for surveillance as she reports she had a TIA earlier this year.   Obesity - BMI 40, weight loss has been challenging for her. She would benefit from GLP1. Will discuss further at next OV.       Dispo: LPA, direct LDL, monitor, cCTA, carotid duplex, nitroglycerin  as needed. Follow up in 3 months, or based on test results.   Signed, Delon JAYSON Hoover, NP  "

## 2023-08-05 ENCOUNTER — Ambulatory Visit: Payer: BC Managed Care – PPO | Attending: Cardiology | Admitting: Cardiology

## 2023-08-05 ENCOUNTER — Ambulatory Visit: Payer: BC Managed Care – PPO

## 2023-08-05 ENCOUNTER — Encounter: Payer: Self-pay | Admitting: Cardiology

## 2023-08-05 VITALS — BP 110/80 | HR 78 | Ht 64.6 in | Wt 241.2 lb

## 2023-08-05 DIAGNOSIS — Z6841 Body Mass Index (BMI) 40.0 and over, adult: Secondary | ICD-10-CM

## 2023-08-05 DIAGNOSIS — I2510 Atherosclerotic heart disease of native coronary artery without angina pectoris: Secondary | ICD-10-CM

## 2023-08-05 DIAGNOSIS — I6529 Occlusion and stenosis of unspecified carotid artery: Secondary | ICD-10-CM

## 2023-08-05 DIAGNOSIS — R072 Precordial pain: Secondary | ICD-10-CM

## 2023-08-05 DIAGNOSIS — E782 Mixed hyperlipidemia: Secondary | ICD-10-CM

## 2023-08-05 DIAGNOSIS — R0600 Dyspnea, unspecified: Secondary | ICD-10-CM

## 2023-08-05 DIAGNOSIS — E66813 Obesity, class 3: Secondary | ICD-10-CM

## 2023-08-05 MED ORDER — NITROGLYCERIN 0.4 MG SL SUBL: 0.4000 mg | SUBLINGUAL_TABLET | SUBLINGUAL | 3 refills | Status: AC | PRN

## 2023-08-05 MED ORDER — METOPROLOL TARTRATE 100 MG PO TABS: 100.0000 mg | ORAL_TABLET | Freq: Once | ORAL | 0 refills | Status: DC

## 2023-08-05 NOTE — Patient Instructions (Signed)
Medication Instructions:  Your physician recommends that you continue on your current medications as directed. Please refer to the Current Medication list given to you today.  *If you need a refill on your cardiac medications before your next appointment, please call your pharmacy*   Lab Work: Your physician recommends that you return for lab work in: Today for Direct LDL and LPa  If you have labs (blood work) drawn today and your tests are completely normal, you will receive your results only by: MyChart Message (if you have MyChart) OR A paper copy in the mail If you have any lab test that is abnormal or we need to change your treatment, we will call you to review the results.   Testing/Procedures:   Your cardiac CT will be scheduled at one of the below locations:    [facility] [address] [place], [place] [address] [phone]    If scheduled at [facility] or [facility], please arrive 15 mins early for check-in and test prep.  There is spacious parking and easy access to the radiology department from the [facility] entrance. Please enter here and check-in with the desk attendant.   Please follow these instructions carefully (unless otherwise directed):  An IV will be required for this test and Nitroglycerin will be given.   On the Night Before the Test: Be sure to Drink plenty of water. Do not consume any caffeinated/decaffeinated beverages or chocolate 12 hours prior to your test. Do not take any antihistamines 12 hours prior to your test. On the Day of the Test: Drink plenty of water until 1 hour prior to the test. Do not eat any food 1 hour prior to test. You may take your regular medications prior to the test.  Take metoprolol (Lopressor) two hours prior to test. FEMALES- please wear underwire-free bra if available, avoid dresses & tight clothing  After the Test: Drink plenty of  water. After receiving IV contrast, you may experience a mild flushed feeling. This is normal. On occasion, you may experience a mild rash up to 24 hours after the test. This is not dangerous. If this occurs, you can take Benadryl 25 mg and increase your fluid intake. If you experience trouble breathing, this can be serious. If it is severe call 911 IMMEDIATELY. If it is mild, please call our office.  We will call to schedule your test 2-4 weeks out understanding that some insurance companies will need an authorization prior to the service being performed.   For more information and frequently asked questions, please visit our website : [email or web address]  For non-scheduling related questions, please contact the cardiac imaging nurse navigator should you have any questions/concerns: Cardiac Imaging Nurse Navigators Direct Office Dial: [phone]   For scheduling needs, including cancellations and rescheduling, please call [place], [phone].   Your physician has requested that you have an echocardiogram. Echocardiography is a painless test that uses sound waves to create images of your heart. It provides your doctor with information about the size and shape of your heart and how well your heart's chambers and valves are working. This procedure takes approximately one hour. There are no restrictions for this procedure. Please do NOT wear cologne, perfume, aftershave, or lotions (deodorant is allowed). Please arrive 15 minutes prior to your appointment time.  Please note: We ask at that you not bring children with you during ultrasound (echo/ vascular) testing. Due to room size and safety concerns, children  are not allowed in the ultrasound rooms during exams. Our front office staff cannot provide observation of children in our lobby area while testing is being conducted. An adult accompanying a patient to their appointment will only be allowed in the ultrasound room at the  discretion of the ultrasound technician under special circumstances. We apologize for any inconvenience.   Your physician has requested that you have a carotid duplex. This test is an ultrasound of the carotid arteries in your neck. It looks at blood flow through these arteries that supply the brain with blood. Allow one hour for this exam. There are no restrictions or special instructions.   You have been asked to wear a Zio Heart Monitor today. It is to be worn for 14 days. Please remove the monitor on Feb. 10th and mail back in the box provided.  If you have any questions about the monitor please call the company at [phone]     Follow-Up: At [facility], you and your health needs are our priority.  As part of our continuing mission to provide you with exceptional heart care, we have created designated Provider Care Teams.  These Care Teams include your primary Cardiologist (physician) and Advanced Practice Providers (APPs -  Physician Assistants and Nurse Practitioners) who all work together to provide you with the care you need, when you need it.  We recommend signing up for the patient portal called "MyChart".  Sign up information is provided on this After Visit Summary.  MyChart is used to connect with patients for Virtual Visits (Telemedicine).  Patients are able to view lab/test results, encounter notes, upcoming appointments, etc.  Non-urgent messages can be sent to your provider as well.   To learn more about what you can do with MyChart, go to [email or web address].    Your next appointment:   3 month(s)  Provider:   Belva Crome, MD    Other Instructions

## 2023-08-07 LAB — LDL CHOLESTEROL, DIRECT: LDL Direct: 137 mg/dL — ABNORMAL HIGH (ref 0–99)

## 2023-08-07 LAB — LIPOPROTEIN A (LPA): Lipoprotein (a): 26.9 nmol/L

## 2023-08-08 ENCOUNTER — Telehealth: Payer: Self-pay | Admitting: Emergency Medicine

## 2023-08-08 DIAGNOSIS — I2510 Atherosclerotic heart disease of native coronary artery without angina pectoris: Secondary | ICD-10-CM

## 2023-08-08 MED ORDER — ROSUVASTATIN CALCIUM 20 MG PO TABS: 20.0000 mg | ORAL_TABLET | Freq: Every day | ORAL | 3 refills | Status: AC

## 2023-08-08 NOTE — Telephone Encounter (Signed)
-----   Message from Flossie Dibble sent at 08/07/2023  8:37 AM EST ----- Cholesterol is too elevated. LPA was normal -- this is good, but we still need better control of lipids.  Previous LFTs were good, so lets start Crestor 20 mg each evening.   Repeat LFTs and FLP in 8 weeks.

## 2023-08-08 NOTE — Telephone Encounter (Signed)
Results reviewed with pt as per Wallis Bamberg NP's note.  Pt verbalized understanding and had no additional questions. Rx sent to pharmacy.  Routed to PCP.

## 2023-08-28 ENCOUNTER — Ambulatory Visit: Payer: BC Managed Care – PPO

## 2023-09-02 ENCOUNTER — Telehealth (HOSPITAL_COMMUNITY): Payer: Self-pay | Admitting: *Deleted

## 2023-09-02 NOTE — Telephone Encounter (Signed)
 Reaching out to patient to offer assistance regarding upcoming cardiac imaging study; pt verbalizes understanding of appt date/time, parking situation and where to check in, pre-test NPO status and medications ordered, and verified current allergies; name and call back number provided for further questions should they arise  Larey Brick RN Navigator Cardiac Imaging [facility] [phone] office [phone] cell  Patient to hold her losartan and take 100mg  metoprolol tartrate two hours prior to her cardiac CT scan.

## 2023-09-03 ENCOUNTER — Encounter (HOSPITAL_BASED_OUTPATIENT_CLINIC_OR_DEPARTMENT_OTHER): Payer: Self-pay

## 2023-09-03 ENCOUNTER — Ambulatory Visit (HOSPITAL_BASED_OUTPATIENT_CLINIC_OR_DEPARTMENT_OTHER)
Admission: RE | Admit: 2023-09-03 | Discharge: 2023-09-03 | Disposition: A | Discharge status: Home / Self Care | Payer: BC Managed Care – PPO | Source: Ambulatory Visit | Attending: Cardiology | Admitting: Cardiology

## 2023-09-03 DIAGNOSIS — R072 Precordial pain: Secondary | ICD-10-CM | POA: Insufficient documentation

## 2023-09-03 DIAGNOSIS — R931 Abnormal findings on diagnostic imaging of heart and coronary circulation: Secondary | ICD-10-CM | POA: Insufficient documentation

## 2023-09-03 MED ORDER — NITROGLYCERIN 0.4 MG SL SUBL
0.8000 mg | SUBLINGUAL_TABLET | Freq: Once | SUBLINGUAL | Status: AC
  Administered 2023-09-03: 0.8 mg via SUBLINGUAL

## 2023-09-03 MED ORDER — IOHEXOL 350 MG/ML SOLN
100.0000 mL | Freq: Once | INTRAVENOUS | Status: AC | PRN
  Administered 2023-09-03: 95 mL via INTRAVENOUS

## 2023-09-05 ENCOUNTER — Ambulatory Visit (HOSPITAL_BASED_OUTPATIENT_CLINIC_OR_DEPARTMENT_OTHER)
Admission: RE | Admit: 2023-09-05 | Discharge: 2023-09-05 | Disposition: A | Discharge status: Home / Self Care | Payer: BC Managed Care – PPO | Source: Ambulatory Visit | Attending: Cardiology

## 2023-09-05 ENCOUNTER — Telehealth: Payer: Self-pay | Admitting: Emergency Medicine

## 2023-09-05 ENCOUNTER — Other Ambulatory Visit: Payer: Self-pay | Admitting: Cardiology

## 2023-09-05 DIAGNOSIS — R931 Abnormal findings on diagnostic imaging of heart and coronary circulation: Secondary | ICD-10-CM

## 2023-09-05 DIAGNOSIS — R072 Precordial pain: Secondary | ICD-10-CM | POA: Diagnosis not present

## 2023-09-05 NOTE — Telephone Encounter (Signed)
-----   Message from Flossie Dibble sent at 08/27/2023  5:06 PM EST ----- Monitor revealed that her average heart rate is 71 bpm, she was predominantly in a normal sinus rhythm.  Rarely did she have extra beats.  Overall, this is a unremarkable event monitor and there is nothing that we need to treat.  This is a reassuring result.

## 2023-09-05 NOTE — Telephone Encounter (Signed)
 Results reviewed with pt as per Karen Bamberg NP's note.  Patient reports that the she is still having palpations and on 09/03/2023 she "felt her heart go out of rhythm". Advised patient I would let Karen Bamberg, NP know. Pt verbalized understanding and had no additional questions. Routed to PCP.

## 2023-09-06 ENCOUNTER — Telehealth: Payer: Self-pay | Admitting: Emergency Medicine

## 2023-09-06 MED ORDER — CLOPIDOGREL BISULFATE 75 MG PO TABS: 75.0000 mg | ORAL_TABLET | Freq: Every day | ORAL | 3 refills | Status: AC

## 2023-09-06 MED ORDER — ISOSORBIDE MONONITRATE ER 30 MG PO TB24: 30.0000 mg | ORAL_TABLET | Freq: Every day | ORAL | 3 refills | Status: AC

## 2023-09-06 NOTE — Telephone Encounter (Addendum)
 Reviewed with patient. She verbalized understanding and had no further questions.

## 2023-09-06 NOTE — Telephone Encounter (Signed)
-----   Message from Flossie Dibble sent at 09/05/2023  7:05 PM EST ----- Coronary CTA is comprised of 2 parts, the first part is read by the cardiologist and the second is read by the radiologist--it may be several weeks before we hear back from the radiologist but what we are most concerned about with his the cardiologist portion.  The coronary CTA revealed that you have coronary artery disease, a calcium score of 436, placing you in the 99th percentile meaning of 100 other women your age and race, you have more plaque in your arteries than most of them.  However, the test revealed that you are getting good blood flow to your heart.   We need to start you on Plavix 75 mg daily, as you mentioned you are allergic to aspirin.    I want to make sure that she did start the Crestor 20 mg daily.  Start Imdur 30 mg daily.   Please review when to take NTG.   Bring her back in 2 weeks to see how she is feeling.

## 2023-09-06 NOTE — Telephone Encounter (Signed)
 Called and spoke to patient to review Coronary CTA results. Reviewed results with patient as per Jimmy Footman NP note. Reviewed recommendations as per Jimmy Footman note with patient. Reviewed when  to take NTG. Appointment made for 09/16/2023. Rx sent to pharmacy. Patient verbalized understanding and had no further questions.

## 2023-09-12 ENCOUNTER — Ambulatory Visit: Payer: BC Managed Care – PPO | Attending: Cardiology

## 2023-09-12 ENCOUNTER — Ambulatory Visit: Payer: BC Managed Care – PPO

## 2023-09-12 DIAGNOSIS — R0600 Dyspnea, unspecified: Secondary | ICD-10-CM | POA: Diagnosis not present

## 2023-09-12 DIAGNOSIS — I2510 Atherosclerotic heart disease of native coronary artery without angina pectoris: Secondary | ICD-10-CM

## 2023-09-12 DIAGNOSIS — I6529 Occlusion and stenosis of unspecified carotid artery: Secondary | ICD-10-CM

## 2023-09-12 DIAGNOSIS — R072 Precordial pain: Secondary | ICD-10-CM | POA: Diagnosis not present

## 2023-09-13 ENCOUNTER — Encounter: Payer: Self-pay | Admitting: Cardiology

## 2023-09-13 LAB — ECHOCARDIOGRAM COMPLETE: S' Lateral: 3 cm

## 2023-09-15 NOTE — Progress Notes (Unsigned)
 Cardiology Office Note:  .   Date:  09/15/2023  ID:  Karen Vargas, DOB August 29, 1970, MRN [id number] PCP: Audie Pinto, FNP   [facility] Providers Cardiologist:  Garwin Brothers, MD    History of Present Illness: .   Karen Vargas is a 53 y.o. female with a past medical history of coronary artery disease noted on CT imaging, asthma, mild bilateral carotid artery stenosis, carpal tunnel syndrome, dyslipidemia, fibromyalgia, palpitations.  09/13/2023 echo EF 60 to 65%, grade 1 DD 09/12/2023 carotid duplex mild bilateral carotid artery stenosis 09/05/2023 coronary CTA calcium score 436, 99th percentile, moderate stenosis, FFR showed flow-limiting stenosis in the PLB with recommendations for aggressive risk factor modification and GDMT and if symptoms persisted then consider left heart cath August 2024 CT of the abdomen >> Right coronary artery atheromatous vascular disease.  10/17/2020 monitor predominantly in sinus rhythm, rare VE's. 01/19/2020 Lexiscan normal, low risk study 11/30/2019 echo EF 60 to 65%, grade 1 DD 11/30/2019 carotid duplex mild bilateral carotid artery stenosis  Most recently evaluated by Dr. Tomie China on 09/28/2020.  She was bothered by palpitations and a monitor was arranged which revealed rare VE, predominantly sinus rhythm.  She presents today to reestablish care, has multiple complaints.  Since she was last evaluated in our office she has been diagnosed with an autoimmune disorder. Today she is concerned with episodes of palpitations, "chest swelling", and DOE.  Regarding her palpitations, her mother had atrial fibrillation, she is concerned that she could have this as well, episodes typically last for several minutes, not associated with any other symptoms.  Regarding her " chest swelling", this is been bothering her for a few weeks where she states she is continuingly had to let her brought out--tried to decipher if this was truly swelling or rather this sensation of  swelling--she states that she has had to go up several bra sizes and by several extenders.  She denies any weight gain. She denies pnd, orthopnea, n, v, dizziness, syncope, edema, weight gain, or early satiety.    ROS: Review of Systems  Constitutional:  Positive for malaise/fatigue.  Respiratory:  Positive for shortness of breath.   Cardiovascular:  Positive for palpitations.       "Chest swelling"  All other systems reviewed and are negative.    Studies Reviewed: .         Risk Assessment/Calculations:     No BP recorded.  {Refresh Note OR Click here to enter BP  :1}***       Physical Exam:   VS:  There were no vitals taken for this visit.   Wt Readings from Last 3 Encounters:  08/05/23 241 lb 3.2 oz (109.4 kg)  09/28/20 239 lb 3.2 oz (108.5 kg)  02/08/20 248 lb (112.5 kg)    GEN: Well nourished, well developed in no acute distress NECK: No JVD; No carotid bruits CARDIAC: RRR, no murmurs, rubs, gallops RESPIRATORY:  Clear to auscultation without rales, wheezing or rhonchi  ABDOMEN: Soft, non-tender, non-distended EXTREMITIES:  No edema; No deformity   ASSESSMENT AND PLAN: .   CAD -right coronary artery disease noted incidentally on CT imaging.  States she is unable to take an aspirin, we discussed starting Plavix however she is not agreeable to do that at this time I would like to wait to see what further testing reveals.  She is having episodes that she describes as chest swelling, this with her known coronary artery disease could be an anginal equivalent will  arrange for coronary CTA. Will arrange for NTG.   Hypertension-blood pressure is well-controlled at 110/80, continue Cozaar 25 mg daily.  Palpitations-will arrange for 2-week monitor.  DOE - will repeat echo for any changes. Likely multifactorial due to deconditioning and obesity.   Dyslipidemia-not currently on any lipid-lowering agents, recent LFTs last week by PCP were normal, will check direct LDL today and  LPA.  Carotid artery stenosis - mild per exam in 2021, will repeat carotid duplex for surveillance as she reports she had a TIA earlier this year.   Obesity - BMI 40, weight loss has been challenging for her. She would benefit from GLP1. Will discuss further at next OV.       Dispo: LPA, direct LDL, monitor, cCTA, carotid duplex, nitroglycerin as needed. Follow up in 3 months, or based on test results.   Signed, Flossie Dibble, NP

## 2023-09-16 ENCOUNTER — Telehealth: Payer: Self-pay | Admitting: Emergency Medicine

## 2023-09-16 ENCOUNTER — Ambulatory Visit: Payer: BC Managed Care – PPO | Attending: Cardiology | Admitting: Cardiology

## 2023-09-16 ENCOUNTER — Encounter: Payer: Self-pay | Admitting: Cardiology

## 2023-09-16 VITALS — BP 118/80 | HR 76 | Ht 64.6 in | Wt 241.0 lb

## 2023-09-16 DIAGNOSIS — E66813 Obesity, class 3: Secondary | ICD-10-CM

## 2023-09-16 DIAGNOSIS — E782 Mixed hyperlipidemia: Secondary | ICD-10-CM | POA: Diagnosis not present

## 2023-09-16 DIAGNOSIS — R002 Palpitations: Secondary | ICD-10-CM

## 2023-09-16 DIAGNOSIS — I2510 Atherosclerotic heart disease of native coronary artery without angina pectoris: Secondary | ICD-10-CM | POA: Diagnosis not present

## 2023-09-16 DIAGNOSIS — Z6841 Body Mass Index (BMI) 40.0 and over, adult: Secondary | ICD-10-CM

## 2023-09-16 DIAGNOSIS — I6529 Occlusion and stenosis of unspecified carotid artery: Secondary | ICD-10-CM | POA: Diagnosis not present

## 2023-09-16 NOTE — Telephone Encounter (Signed)
-----   Message from Flossie Dibble sent at 09/13/2023  3:29 PM EST ----- Carotid duplex reveals mild carotid artery stenosis, unchanged from prior exam, so this is a stable result. Already on Plavix and Crestor, so this is the treatment for this at this time.

## 2023-09-16 NOTE — Telephone Encounter (Signed)
 Results reviewed with pt as per Wallis Bamberg NP's note.  Pt verbalized understanding and had no additional questions. Routed to PCP.

## 2023-09-16 NOTE — Patient Instructions (Signed)
 Medication Instructions:  Your physician recommends that you continue on your current medications as directed. Please refer to the Current Medication list given to you today.  *If you need a refill on your cardiac medications before your next appointment, please call your pharmacy*   Lab Work: Your physician recommends that you return for lab work in: when fasting You need to have labs done when you are fasting.  You can come Monday through Friday 8:30 am to 12:00 pm and 1:15 to 4:30. You do not need to make an appointment as the order has already been placed. The labs you are going to have done are CMP, Lipids.    Testing/Procedures: None Ordered   Follow-Up: At [facility], you and your health needs are our priority.  As part of our continuing mission to provide you with exceptional heart care, we have created designated Provider Care Teams.  These Care Teams include your primary Cardiologist (physician) and Advanced Practice Providers (APPs -  Physician Assistants and Nurse Practitioners) who all work together to provide you with the care you need, when you need it.  We recommend signing up for the patient portal called "MyChart".  Sign up information is provided on this After Visit Summary.  MyChart is used to connect with patients for Virtual Visits (Telemedicine).  Patients are able to view lab/test results, encounter notes, upcoming appointments, etc.  Non-urgent messages can be sent to your provider as well.   To learn more about what you can do with MyChart, go to [email or web address].    Your next appointment:   6 week(s)  The format for your next appointment:   In Person  Provider:   Festus Barren   Other Instructions  FDA-cleared personal EKG: The world's most clinically validated personal EKG, FDA-cleared to detect Atrial Fibrillation, Bradycardia, and Tachycardia. Mayford Knife is the most reliable way to check in on your heart from home. Take your EKG from  anywhere: Capture a medical-grade EKG in 30 seconds and get an instant analysis right on your smartphone. Mayford Knife is small enough to fit in your pocket, so you can take it with you anywhere. Easy to use: Simply place your fingers on the sensors--no wires, patches, or gels. Recommended by doctors: A trusted resource, Mayford Knife is the #1 doctor-recommended personal EKG with more than 100 million EKGs recorded. Save or share your EKGs: With the press of a button, email your EKGs to your doctor or save them on your phone. Works with smartphones: Compatible with Event organiser and tablets. Check our compatibility chart. FSA/HSA eligible: Purchase using an FSA or HSA account (please confirm coverage with your insurance provider). Phone clip included with purchase, a $15 value. Conveniently take your device with you wherever you go.  [email or web address]   Step One- Record your EKG strip on [facility] app.   Step two- On Kardia EKG click "Download"   Step three- It will prompt you to make a password for this EKG. Please make the password "[email or web address]" so that we can view it.   Step four- Click on the little "upload" button (small box with an arrow in the middle) in the bottom left-hand corner of the screen.   Step five- Click "Save to Files"  Step six- Click on "On my iphone" and then "Pages" then press save in the top right-hand corner.   NOW GO TO MYCHART   Once on MyChart click "Messages"  Step one- Click "Send a message"  Step two- Click "Ask a  medical question"   Step three- Click "Non urgent medical question"   Step four- Click on Rajan Revankar's name.  Step five- Click on the small paperclip at the bottom of the screen  Step six- Click "Choose file"  Step seven- Pick the most recent EKG strip listed.   Once uploaded send the message!

## 2023-09-26 ENCOUNTER — Telehealth: Payer: Self-pay | Admitting: Cardiology

## 2023-09-26 NOTE — Telephone Encounter (Signed)
 Pt c/o medication issue:  1. Name of Medication: Orencia infusion  2. How are you currently taking this medication (dosage and times per day)? Taking infusion Tuesday  3. Are you having a reaction (difficulty breathing--STAT)? No   4. What is your medication issue? She wants to make she it is ok to do with all the other medications she is on

## 2023-09-27 NOTE — Telephone Encounter (Signed)
 Left message on My Chart with Jimmy Footman reply to message: "I am not aware of any interactions, however this is a very specific medication and I would imagine that whomever arranged it should have checked for interactions as well. "

## 2023-10-16 ENCOUNTER — Encounter: Payer: Self-pay | Admitting: Cardiology

## 2023-10-30 NOTE — Progress Notes (Unsigned)
 Cardiology Office Note:  .   Date:  10/31/2023  ID:  Karen Vargas, DOB 14-Aug-1970, MRN [id number] PCP: Mina Alter, FNP  [facility] Providers Cardiologist:  None    History of Present Illness: .   Karen Vargas is a 53 y.o. female with a past medical history of coronary artery disease noted on CT imaging, asthma, mild bilateral carotid artery stenosis, carpal tunnel syndrome, dyslipidemia, fibromyalgia, palpitations.  09/13/2023 echo EF 60 to 65%, grade 1 DD 09/12/2023 carotid duplex mild bilateral carotid artery stenosis 09/05/2023 coronary CTA calcium  score 436, 99th percentile, moderate stenosis, FFR showed flow-limiting stenosis in the PLB with recommendations for aggressive risk factor modification and GDMT and if symptoms persisted then consider left heart cath 08/02/2023 monitor average HR 71 bpm, predominant rhythm was SR August 2024 CT of the abdomen >> Right coronary artery atheromatous vascular disease.  10/17/2020 monitor predominantly in sinus rhythm, rare VE's. 01/19/2020 Lexiscan  normal, low risk study 11/30/2019 echo EF 60 to 65%, grade 1 DD 11/30/2019 carotid duplex mild bilateral carotid artery stenosis  Most recently evaluated by Dr. Lafayette Pierre on 09/28/2020.  She was bothered by palpitations and a monitor was arranged which revealed rare VE, predominantly sinus rhythm.  Evaluated on 08/05/2023 to reestablish care, had multiple complaints that were hard for her to describe.  She was most bothered by palpitations, DOE, and chest swelling--stated that she had to buy several bra extenders to extend her bra by the end of the day.  She had also been evaluated with an autoimmune disorder since last evaluated in our office.  We arranged for a monitor, coronary CTA, carotid duplex.  Coronary CTA revealed CAD possibly flow-limiting to the PLB.  Evaluated by myself on 09/16/2023, we had started her on Imdur , Plavix , Crestor  and she was tolerating all well unfortunately, she had suffered  a fall and injured her leg.  She presents today for follow-up of her CAD.  Overall, she is feeling relatively well.  She does have some symptoms that are hard for her to decipher, we did discuss increasing her Imdur , possibly proceeding with a left heart cath as some of her symptoms could be an anginal equivalent however overall she states she feels better than she previously had and wants to wait till she is healed from her injured leg. She denies chest pain, palpitations, dyspnea, pnd, orthopnea, n, v, dizziness, syncope, edema, weight gain, or early satiety.    ROS: Review of Systems  Constitutional:  Positive for malaise/fatigue.  Musculoskeletal:  Positive for joint pain.  All other systems reviewed and are negative.    Studies Reviewed: .         Risk Assessment/Calculations:             Physical Exam:   VS:  BP 120/80   Pulse 76   Ht 5' 4.6" (1.641 m)   Wt 249 lb (112.9 kg) Comment: verbal  SpO2 96%   BMI 41.95 kg/m    Wt Readings from Last 3 Encounters:  10/31/23 249 lb (112.9 kg)  09/16/23 241 lb (109.3 kg)  08/05/23 241 lb 3.2 oz (109.4 kg)    GEN: Well nourished, well developed in no acute distress NECK: No JVD; No carotid bruits CARDIAC: RRR, no murmurs, rubs, gallops RESPIRATORY:  Clear to auscultation without rales, wheezing or rhonchi  ABDOMEN: Soft, non-tender, non-distended EXTREMITIES:  No edema; No deformity   ASSESSMENT AND PLAN: .   CAD -coronary CTA calcium  score 436, 99th percentile, moderate  stenosis, FFR showed flow-limiting stenosis in the PLB with recommendations for aggressive risk factor modification and GDMT and if symptoms persisted then consider left heart cath.  We started her on Imdur , she is tolerating this well and she actually feels slightly better.  Continue Plavix  75 mg daily, continue Crestor  20 mg daily, continue metoprolol  succinate 25 mg daily.  Hypertension-blood pressure is well-controlled at 120/80, continue Cozaar 25 mg daily,  continue metoprolol  25 mg daily.  Palpitations-currently quiescent for her,  Continue metoprolol  25 mg daily.  Dyslipidemia-LDL was very elevated at 137 however LP(a) was normal.  Previously started her on Crestor  20 mg daily we will repeat CMET and fasting lipid panel.  Carotid artery stenosis -mild per most recent exam earlier this year.  Obesity - BMI 40, weight loss has been challenging for her, Heart healthy diet and regular cardiovascular exercise encouraged.        Dispo: CMET, fasting lipid panel, follow-up in 3 months.  Signed, Terrance Ferretti, NP

## 2023-10-31 ENCOUNTER — Ambulatory Visit: Attending: Cardiology | Admitting: Cardiology

## 2023-10-31 ENCOUNTER — Encounter: Payer: Self-pay | Admitting: Cardiology

## 2023-10-31 VITALS — BP 120/80 | HR 76 | Ht 64.6 in | Wt 249.0 lb

## 2023-10-31 DIAGNOSIS — Z6841 Body Mass Index (BMI) 40.0 and over, adult: Secondary | ICD-10-CM

## 2023-10-31 DIAGNOSIS — E66813 Obesity, class 3: Secondary | ICD-10-CM

## 2023-10-31 DIAGNOSIS — R002 Palpitations: Secondary | ICD-10-CM

## 2023-10-31 DIAGNOSIS — E782 Mixed hyperlipidemia: Secondary | ICD-10-CM | POA: Diagnosis not present

## 2023-10-31 DIAGNOSIS — I6529 Occlusion and stenosis of unspecified carotid artery: Secondary | ICD-10-CM

## 2023-10-31 DIAGNOSIS — I2510 Atherosclerotic heart disease of native coronary artery without angina pectoris: Secondary | ICD-10-CM | POA: Diagnosis not present

## 2023-10-31 NOTE — Patient Instructions (Signed)
 Medication Instructions:  Your physician recommends that you continue on your current medications as directed. Please refer to the Current Medication list given to you today.  *If you need a refill on your cardiac medications before your next appointment, please call your pharmacy*  Lab Work: Your physician recommends that you return for lab work in:   Labs today: CMP, Lipid   If you have labs (blood work) drawn today and your tests are completely normal, you will receive your results only by: MyChart Message (if you have MyChart) OR A paper copy in the mail If you have any lab test that is abnormal or we need to change your treatment, we will call you to review the results.  Testing/Procedures: None  Follow-Up: At [facility], you and your health needs are our priority.  As part of our continuing mission to provide you with exceptional heart care, our providers are all part of one team.  This team includes your primary Cardiologist (physician) and Advanced Practice Providers or APPs (Physician Assistants and Nurse Practitioners) who all work together to provide you with the care you need, when you need it.  Your next appointment:   3 month(s)  Provider:   Ralene Burger, MD    We recommend signing up for the patient portal called "MyChart".  Sign up information is provided on this After Visit Summary.  MyChart is used to connect with patients for Virtual Visits (Telemedicine).  Patients are able to view lab/test results, encounter notes, upcoming appointments, etc.  Non-urgent messages can be sent to your provider as well.   To learn more about what you can do with MyChart, go to [email or web address].   Other Instructions None

## 2023-11-01 LAB — COMPREHENSIVE METABOLIC PANEL WITH GFR
ALT: 21 IU/L (ref 0–32)
AST: 21 IU/L (ref 0–40)
Albumin: 4.3 g/dL (ref 3.8–4.9)
Alkaline Phosphatase: 93 IU/L (ref 44–121)
BUN/Creatinine Ratio: 11 (ref 9–23)
BUN: 12 mg/dL (ref 6–24)
Bilirubin Total: 0.9 mg/dL (ref 0.0–1.2)
CO2: 21 mmol/L (ref 20–29)
Calcium: 9.5 mg/dL (ref 8.7–10.2)
Chloride: 104 mmol/L (ref 96–106)
Creatinine, Ser: 1.12 mg/dL — ABNORMAL HIGH (ref 0.57–1.00)
Globulin, Total: 1.9 g/dL (ref 1.5–4.5)
Glucose: 105 mg/dL — ABNORMAL HIGH (ref 70–99)
Potassium: 4.3 mmol/L (ref 3.5–5.2)
Sodium: 140 mmol/L (ref 134–144)
Total Protein: 6.2 g/dL (ref 6.0–8.5)
eGFR: 59 mL/min/1.73 — ABNORMAL LOW

## 2023-11-01 LAB — LIPID PANEL
Chol/HDL Ratio: 2.7 ratio (ref 0.0–4.4)
Cholesterol, Total: 109 mg/dL (ref 100–199)
HDL: 40 mg/dL
LDL Chol Calc (NIH): 44 mg/dL (ref 0–99)
Triglycerides: 149 mg/dL (ref 0–149)
VLDL Cholesterol Cal: 25 mg/dL (ref 5–40)

## 2023-12-28 ENCOUNTER — Ambulatory Visit (HOSPITAL_BASED_OUTPATIENT_CLINIC_OR_DEPARTMENT_OTHER)
Admission: EM | Admit: 2023-12-28 | Discharge: 2023-12-28 | Disposition: A | Discharge status: Home / Self Care | Attending: Family Medicine | Admitting: Family Medicine

## 2023-12-28 ENCOUNTER — Encounter (HOSPITAL_BASED_OUTPATIENT_CLINIC_OR_DEPARTMENT_OTHER): Payer: Self-pay

## 2023-12-28 DIAGNOSIS — J320 Chronic maxillary sinusitis: Secondary | ICD-10-CM

## 2023-12-28 MED ORDER — TRIAMCINOLONE ACETONIDE 40 MG/ML IJ SUSP
40.0000 mg | Freq: Once | INTRAMUSCULAR | Status: AC
  Administered 2023-12-28: 40 mg via INTRAMUSCULAR

## 2023-12-28 NOTE — Discharge Instructions (Signed)
 Kenalog  injection given here for sinus inflammation.  Recommend make sure you are taking Zyrtec every day. Follow-up as needed

## 2023-12-28 NOTE — ED Triage Notes (Signed)
 States had been experiencing cough, nasal congestion. Had a full script of amoxicillin at home and took that with her physician's acknowledgment. States antibiotics have not helped. Now feeling right ear pressure as well as cough, nasal congestion. Patient takes infusions for her RA and unable to continue while sick.

## 2024-01-01 NOTE — ED Provider Notes (Signed)
 [facility]    CSN: [id number] Arrival date & time: 12/28/23  1119      History   Chief Complaint Chief Complaint  Patient presents with   Nasal Congestion   Cough    HPI Karen Vargas is a 53 y.o. female.   Patient is a 53 year old female who presents today with cough, nasal congestion. Had a full script of amoxicillin at home and took that with her physician's acknowledgment. States antibiotics have not helped. Now feeling right ear pressure as well as cough, nasal congestion. Patient takes infusions for her RA and unable to continue while sick.  No fever   Cough   Past Medical History:  Diagnosis Date   Asthma    B12 deficiency 07/20/2020   Bilateral carotid bruits 10/26/2019   Cardiac murmur    Carotid artery stenosis 2021   Mild, bilateral   Carpal tunnel syndrome of right wrist 01/01/2019   Chest tightness 01/26/2019   Chronic arthralgias of knees and hips 07/20/2020   Coronary artery disease 2024   Right coronary artery atheromatous vascular disease noted on CT of the abdomen   DOE (dyspnea on exertion) 10/26/2019   Fibromyalgia    History of prediabetes 10/20/2019   Hyperlipidemia 09/28/2020   Inflammatory arthritis 09/28/2020   Long-term use of aspirin therapy 07/20/2020   Malaise and fatigue 10/20/2019   Mixed hyperlipidemia 10/20/2019   Moderate persistent asthma 10/20/2019   Morbid obesity (HCC) 01/26/2019   Obesity (BMI 35.0-39.9 without comorbidity) 01/27/2020   Osteoarthritis 09/28/2020   Osteoporosis    Palpitations 09/28/2020   Personal history of COVID-19 10/20/2019   Formatting of this note might be different from the original. 06/14/19   Pneumonia due to COVID-19 virus 06/14/2019   Prediabetes 09/28/2020   Preoperative cardiovascular examination 01/26/2019   Strain of neck muscle 01/01/2019   Vitamin D deficiency 09/28/2020    Patient Active Problem List   Diagnosis Date Noted   Inflammatory arthritis 09/28/2020    Osteoarthritis 09/28/2020   Prediabetes 09/28/2020   Vitamin D deficiency 09/28/2020   Hyperlipidemia 09/28/2020   Palpitations 09/28/2020   Cardiac murmur    Fibromyalgia    Osteoporosis    B12 deficiency 07/20/2020   Chronic arthralgias of knees and hips 07/20/2020   Long-term use of aspirin therapy 07/20/2020   Asthma 01/27/2020   Obesity (BMI 35.0-39.9 without comorbidity) 01/27/2020   DOE (dyspnea on exertion) 10/26/2019   Bilateral carotid bruits 10/26/2019   History of prediabetes 10/20/2019   Malaise and fatigue 10/20/2019   Mixed hyperlipidemia 10/20/2019   Moderate persistent asthma 10/20/2019   Personal history of COVID-19 10/20/2019   Pneumonia due to COVID-19 virus 06/14/2019   Preoperative cardiovascular examination 01/26/2019   Chest tightness 01/26/2019   Morbid obesity (HCC) 01/26/2019   Carpal tunnel syndrome of right wrist 01/01/2019   Strain of neck muscle 01/01/2019    Past Surgical History:  Procedure Laterality Date   ABDOMINAL HYSTERECTOMY     APPENDECTOMY     CHOLECYSTECTOMY     HAND SURGERY Right    KNEE SURGERY      OB History   No obstetric history on file.      Home Medications    Prior to Admission medications   Medication Sig Start Date End Date Taking? Authorizing Provider  Abatacept (ORENCIA IV) Inject 1,000 mg into the vein every 30 (thirty) days.    [provider]  albuterol (PROAIR HFA) 108 (90 Base) MCG/ACT inhaler Inhale 2 puffs  into the lungs as needed for wheezing or shortness of breath.    [provider]  clopidogrel  (PLAVIX ) 75 MG tablet Take 1 tablet (75 mg total) by mouth daily. 09/06/23   Carlin Delon BROCKS, NP  Cyanocobalamin  (VITAMIN B12) 1000 MCG TBCR Take 1,000 mcg by mouth daily in the afternoon.    [provider]  DULoxetine (CYMBALTA) 30 MG capsule Take 30 mg by mouth daily. 08/13/23   [provider]  isosorbide  mononitrate (IMDUR ) 30 MG 24 hr tablet Take 1 tablet (30 mg total)  by mouth daily. 09/06/23 12/05/23  Carlin Delon BROCKS, NP  loratadine (CLARITIN) 10 MG tablet Take 10 mg by mouth as needed for allergies or rhinitis.    [provider]  metoprolol  succinate (TOPROL -XL) 25 MG 24 hr tablet Take 1 tablet by mouth daily. 09/09/23   [provider]  nitroGLYCERIN  (NITROSTAT ) 0.4 MG SL tablet Place 1 tablet (0.4 mg total) under the tongue every 5 (five) minutes as needed for chest pain. 08/05/23 11/03/23  Carlin Delon BROCKS, NP  rosuvastatin  (CRESTOR ) 20 MG tablet Take 1 tablet (20 mg total) by mouth daily. 08/08/23 11/06/23  Carlin Delon BROCKS, NP  Vitamin D, Ergocalciferol, (DRISDOL) 1.25 MG (50000 UNIT) CAPS capsule Take 50,000 Units by mouth once a week. 08/22/20   [provider]    Family History Family History  Problem Relation Age of Onset   Hypertension Mother    Diabetes Mother    Stroke Mother    Hypertension Father    Hypertension Sister    Cancer Paternal Aunt    Diabetes Maternal Grandmother    Heart attack Paternal Grandmother    Heart attack Paternal Grandfather     Social History Social History   Tobacco Use   Smoking status: Former   Smokeless tobacco: Never  Substance Use Topics   Alcohol use: Yes    Comment: occ   Drug use: Never     Allergies   Ciprofloxacin, Haemophilus b polysaccharide vaccine, Amoxicillin-pot clavulanate, Aspirin, Doxycycline, Fluzone quadrivalent [influenza vac split quad], Gabapentin, Influenza vaccines, Meloxicam, Penicillin g, Prednisone, and Tramadol   Review of Systems Review of Systems  Respiratory:  Positive for cough.   See HPI   Physical Exam Triage Vital Signs ED Triage Vitals  Encounter Vitals Group     BP 12/28/23 1145 105/68     Girls Systolic BP Percentile --      Girls Diastolic BP Percentile --      Boys Systolic BP Percentile --      Boys Diastolic BP Percentile --      Pulse Rate 12/28/23 1145 71     Resp 12/28/23 1145 20     Temp 12/28/23 1145 98.3 F (36.8  C)     Temp src --      SpO2 12/28/23 1145 96 %     Weight --      Height --      Head Circumference --      Peak Flow --      Pain Score 12/28/23 1147 2     Pain Loc --      Pain Education --      Exclude from Growth Chart --    No data found.  Updated Vital Signs BP 105/68 (BP Location: Left Arm)   Pulse 71   Temp 98.3 F (36.8 C)   Resp 20   SpO2 96%   Visual Acuity Right Eye Distance:   Left Eye Distance:  Bilateral Distance:    Right Eye Near:   Left Eye Near:    Bilateral Near:     Physical Exam Constitutional:      General: She is not in acute distress.    Appearance: Normal appearance. She is not ill-appearing, toxic-appearing or diaphoretic.  HENT:     Head: Normocephalic and atraumatic.     Right Ear: Tympanic membrane and ear canal normal.     Left Ear: Tympanic membrane and ear canal normal.     Nose: Congestion and rhinorrhea present.     Mouth/Throat:     Pharynx: Oropharynx is clear. Posterior oropharyngeal erythema present.   Eyes:     Conjunctiva/sclera: Conjunctivae normal.    Cardiovascular:     Rate and Rhythm: Normal rate and regular rhythm.     Pulses: Normal pulses.     Heart sounds: Normal heart sounds.  Pulmonary:     Effort: Pulmonary effort is normal.     Breath sounds: Normal breath sounds.   Skin:    General: Skin is warm and dry.   Neurological:     Mental Status: She is alert.   Psychiatric:        Mood and Affect: Mood normal.      UC Treatments / Results  Labs (all labs ordered are listed, but only abnormal results are displayed) Labs Reviewed - No data to display  EKG   Radiology No results found.  Procedures Procedures (including critical care time)  Medications Ordered in UC Medications  triamcinolone  acetonide (KENALOG -40) injection 40 mg (40 mg Intramuscular Given 12/28/23 1208)    Initial Impression / Assessment and Plan / UC Course  I have reviewed the triage vital signs and the nursing  notes.  Pertinent labs & imaging results that were available during my care of the patient were reviewed by me and considered in my medical decision making (see chart for details).     Chronic sinusitis-Kenalog  injection given here for sinus inflammation.  Recommend make sure she is taking Zyrtec daily. Can continue the amoxicillin if she would like Follow-up as needed for any continued issues Final Clinical Impressions(s) / UC Diagnoses   Final diagnoses:  Chronic maxillary sinusitis     Discharge Instructions      Kenalog  injection given here for sinus inflammation.  Recommend make sure you are taking Zyrtec every day. Follow-up as needed   ED Prescriptions   None    PDMP not reviewed this encounter.   Adah Wilbert LABOR, FNP 01/01/24 [phone]

## 2024-01-07 ENCOUNTER — Ambulatory Visit: Admitting: Neurology

## 2024-01-22 ENCOUNTER — Ambulatory Visit: Admitting: Neurology

## 2024-01-30 ENCOUNTER — Ambulatory Visit: Attending: Cardiology | Admitting: Cardiology

## 2024-01-30 ENCOUNTER — Encounter: Payer: Self-pay | Admitting: Cardiology

## 2024-01-30 VITALS — BP 124/86 | HR 88 | Ht 64.0 in | Wt 248.0 lb

## 2024-01-30 DIAGNOSIS — E782 Mixed hyperlipidemia: Secondary | ICD-10-CM | POA: Diagnosis not present

## 2024-01-30 DIAGNOSIS — R0609 Other forms of dyspnea: Secondary | ICD-10-CM

## 2024-01-30 DIAGNOSIS — I25118 Atherosclerotic heart disease of native coronary artery with other forms of angina pectoris: Secondary | ICD-10-CM

## 2024-01-30 DIAGNOSIS — Z87898 Personal history of other specified conditions: Secondary | ICD-10-CM

## 2024-01-30 DIAGNOSIS — I2510 Atherosclerotic heart disease of native coronary artery without angina pectoris: Secondary | ICD-10-CM | POA: Insufficient documentation

## 2024-01-30 NOTE — Patient Instructions (Signed)

## 2024-01-30 NOTE — Progress Notes (Signed)
 Cardiology Office Note:    Date:  01/30/2024   ID:  Karen Vargas, DOB 1971-04-26, MRN [id number]  PCP:  Jackolyn Darice BROCKS, FNP  Cardiologist:  Lamar Fitch, MD    Referring MD: Jackolyn Darice BROCKS, FNP   Chief Complaint  Patient presents with   Follow-up    History of Present Illness:    Karen Vargas is a 53 y.o. female past medical history significant for coronary artery disease, she did have coronary CT angio done which showed calcium  score of 436 which is 99th percentile, FFR showed flow-limiting stenosis in the posterior lateral branch recommendation was to risk factors modifications.  She has been put on antiplatelet therapy she takes Plavix , also Crestor  which reduce her cholesterol nicely.  Comes today to months for follow-up overall doing well but she did have some orthopedic injury she was unable to exercise for a while now she is trying to go back to what she described to have some shortness of breath she thinks it is related to the fact that she did not exercise for a while.  Describes 1 episode of chest pain while ago.  Past Medical History:  Diagnosis Date   Asthma    B12 deficiency 07/20/2020   Bilateral carotid bruits 10/26/2019   Cardiac murmur    Carotid artery stenosis 2021   Mild, bilateral   Carpal tunnel syndrome of right wrist 01/01/2019   Chest tightness 01/26/2019   Chronic arthralgias of knees and hips 07/20/2020   Coronary artery disease 2024   Right coronary artery atheromatous vascular disease noted on CT of the abdomen   DOE (dyspnea on exertion) 10/26/2019   Fibromyalgia    History of prediabetes 10/20/2019   Hyperlipidemia 09/28/2020   Inflammatory arthritis 09/28/2020   Long-term use of aspirin therapy 07/20/2020   Malaise and fatigue 10/20/2019   Mixed hyperlipidemia 10/20/2019   Moderate persistent asthma 10/20/2019   Morbid obesity (HCC) 01/26/2019   Obesity (BMI 35.0-39.9 without comorbidity) 01/27/2020   Osteoarthritis 09/28/2020    Osteoporosis    Palpitations 09/28/2020   Personal history of COVID-19 10/20/2019   Formatting of this note might be different from the original. 06/14/19   Pneumonia due to COVID-19 virus 06/14/2019   Prediabetes 09/28/2020   Preoperative cardiovascular examination 01/26/2019   Strain of neck muscle 01/01/2019   Vitamin D deficiency 09/28/2020    Past Surgical History:  Procedure Laterality Date   ABDOMINAL HYSTERECTOMY     APPENDECTOMY     CHOLECYSTECTOMY     HAND SURGERY Right    KNEE SURGERY      Current Medications: Current Meds  Medication Sig   Abatacept (ORENCIA IV) Inject 1,000 mg into the vein every 30 (thirty) days.   albuterol (PROAIR HFA) 108 (90 Base) MCG/ACT inhaler Inhale 2 puffs into the lungs as needed for wheezing or shortness of breath.   cephALEXin (KEFLEX) 250 MG capsule Take 250 mg by mouth 4 (four) times daily. Tx complete on 02/06/2024   clopidogrel  (PLAVIX ) 75 MG tablet Take 1 tablet (75 mg total) by mouth daily.   Cyanocobalamin  (VITAMIN B12) 1000 MCG TBCR Take 1,000 mcg by mouth daily in the afternoon.   DULoxetine (CYMBALTA) 30 MG capsule Take 30 mg by mouth daily.   isosorbide  mononitrate (IMDUR ) 30 MG 24 hr tablet Take 1 tablet (30 mg total) by mouth daily.   loratadine (CLARITIN) 10 MG tablet Take 10 mg by mouth as needed for allergies or rhinitis.   metoprolol  succinate (TOPROL -XL)  25 MG 24 hr tablet Take 1 tablet by mouth daily.   nitroGLYCERIN  (NITROSTAT ) 0.4 MG SL tablet Place 1 tablet (0.4 mg total) under the tongue every 5 (five) minutes as needed for chest pain.   rosuvastatin  (CRESTOR ) 20 MG tablet Take 1 tablet (20 mg total) by mouth daily.   Vitamin D, Ergocalciferol, (DRISDOL) 1.25 MG (50000 UNIT) CAPS capsule Take 50,000 Units by mouth once a week.     Allergies:   Ciprofloxacin, Haemophilus b polysaccharide vaccine, Amoxicillin-pot clavulanate, Aspirin, Doxycycline, Fluzone quadrivalent [influenza vac split quad], Gabapentin, Influenza  vaccines, Meloxicam, Penicillin g, Prednisone, and Tramadol   Social History   Socioeconomic History   Marital status: Married    Spouse name: Not on file   Number of children: Not on file   Years of education: Not on file   Highest education level: Not on file  Occupational History   Not on file  Tobacco Use   Smoking status: Former   Smokeless tobacco: Never  Substance and Sexual Activity   Alcohol use: Yes    Comment: occ   Drug use: Never   Sexual activity: Not on file  Other Topics Concern   Not on file  Social History Narrative   Not on file   Social Drivers of Health   Financial Resource Strain: Not on file  Food Insecurity: Low Risk  (03/18/2023)   Received from [facility]   Hunger Vital Sign    Within the past 12 months, you worried that your food would run out before you got money to buy more: Never true    Within the past 12 months, the food you bought just didn't last and you didn't have money to get more. : Never true  Transportation Needs: No Transportation Needs (03/18/2023)   Received from [facility]    In the past 12 months, has lack of reliable transportation kept you from medical appointments, meetings, work or from getting things needed for daily living? : No  Physical Activity: Not on file  Stress: Not on file  Social Connections: Unknown (11/17/2021)   Received from [employer]   Social Network    Social Network: Not on file     Family History: The patient's family history includes Cancer in her paternal aunt; Diabetes in her maternal grandmother and mother; Heart attack in her paternal grandfather and paternal grandmother; Hypertension in her father, mother, and sister; Stroke in her mother. ROS:   Please see the history of present illness.    All 14 point review of systems negative except as described per history of present illness  EKGs/Labs/Other Studies Reviewed:         Recent Labs: 10/31/2023: ALT 21; BUN 12;  Creatinine, Ser 1.12; Potassium 4.3; Sodium 140  Recent Lipid Panel    Component Value Date/Time   CHOL 109 10/31/2023 1053   TRIG 149 10/31/2023 1053   HDL 40 10/31/2023 1053   CHOLHDL 2.7 10/31/2023 1053   LDLCALC 44 10/31/2023 1053   LDLDIRECT 137 (H) 08/05/2023 1127    Physical Exam:    VS:  BP 124/86 (BP Location: Left Arm, Patient Position: Sitting)   Pulse 88   Ht 5' 4 (1.626 m)   Wt 248 lb (112.5 kg)   SpO2 96%   BMI 42.57 kg/m     Wt Readings from Last 3 Encounters:  01/30/24 248 lb (112.5 kg)  10/31/23 249 lb (112.9 kg)  09/16/23 241 lb (109.3 kg)  GEN:  Well nourished, well developed in no acute distress HEENT: Normal NECK: No JVD; No carotid bruits LYMPHATICS: No lymphadenopathy CARDIAC: RRR, no murmurs, no rubs, no gallops RESPIRATORY:  Clear to auscultation without rales, wheezing or rhonchi  ABDOMEN: Soft, non-tender, non-distended MUSCULOSKELETAL:  No edema; No deformity  SKIN: Warm and dry LOWER EXTREMITIES: no swelling NEUROLOGIC:  Alert and oriented x 3 PSYCHIATRIC:  Normal affect   ASSESSMENT:    1. Coronary artery disease of native artery of native heart with stable angina pectoris (HCC)   2. History of prediabetes   3. Mixed hyperlipidemia   4. DOE (dyspnea on exertion)    PLAN:    In order of problems listed above:  Coronary disease.  On antiplatelet therapy denies have any recent symptoms.  On antianginal therapy will continue.  I asked her to let me know if she develops some chest pain. History of prediabetes.  I do not have any recent hemoglobin A1c but we did spend great of time talking about need to exercise on the regular basis which hopefully she will do. Mixed dyslipidemia I did review K PN which show me her LDL 44 HDL 40 is from 03/01/2024 we will continue present management. Dyspnea on exertion multifactorial: Sedentary lifestyle as well as obesity play some role here.  She understands this with this discussed basic of  Mediterranean diet, we also talked about need to exercise on a regular basis   Medication Adjustments/Labs and Tests Ordered: Current medicines are reviewed at length with the patient today.  Concerns regarding medicines are outlined above.  No orders of the defined types were placed in this encounter.  Medication changes: No orders of the defined types were placed in this encounter.   Signed, Lamar DOROTHA Fitch, MD, [facility] 01/30/2024 4:35 PM    [facility]

## 2024-02-03 ENCOUNTER — Encounter: Payer: Self-pay | Admitting: Neurology

## 2024-02-03 ENCOUNTER — Ambulatory Visit (INDEPENDENT_AMBULATORY_CARE_PROVIDER_SITE_OTHER): Admitting: Neurology

## 2024-02-03 VITALS — BP 131/74 | HR 52 | Ht 64.0 in | Wt 248.4 lb

## 2024-02-03 DIAGNOSIS — R519 Headache, unspecified: Secondary | ICD-10-CM

## 2024-02-03 DIAGNOSIS — G4719 Other hypersomnia: Secondary | ICD-10-CM | POA: Diagnosis not present

## 2024-02-03 NOTE — Patient Instructions (Signed)
 Sleep evaluation    Sleep Apnea  Sleep apnea is a condition that affects your breathing while you are sleeping. Your tongue or soft tissue in your throat may block the flow of air while you sleep. You may have shallow breathing or stop breathing for short periods of time. People with sleep apnea may snore loudly. There are three kinds of sleep apnea: Obstructive sleep apnea. This kind is caused by a blocked or collapsed airway. This is the most common. Central sleep apnea. This kind happens when the part of the brain that controls breathing does not send the correct signals to the muscles that control breathing. Mixed sleep apnea. This is a combination of obstructive and central sleep apnea. What are the causes? The most common cause of sleep apnea is a collapsed or blocked airway. What increases the risk? Being very overweight. Having family members with sleep apnea. Having a tongue or tonsils that are larger than normal. Having a small airway or jaw problems. Being older. What are the signs or symptoms? Loud snoring. Restless sleep. Trouble staying asleep. Being sleepy or tired during the day. Waking up gasping or choking. Having a headache in the morning. Mood swings. Having a hard time remembering things and concentrating. How is this diagnosed? A medical history. A physical exam. A sleep study. This is also called a polysomnography test. This test is done at a sleep lab or in your home while you are sleeping. How is this treated? Treatment may include: Sleeping on your side. Losing weight if you're overweight. Wearing an oral appliance. This is a mouthpiece that moves your lower jaw forward. Using a positive airway pressure (PAP) device to keep your airways open while you sleep, such as: A continuous positive airway pressure (CPAP) device. This device gives forced air through a mask when you breathe out. This keeps your airways open. A bilevel positive airway pressure  (BIPAP) device. This device gives forced air through a mask when you breathe in and when you breathe out to keep your airways open. Having surgery if other treatments do not work. If your sleep apnea is not treated, you may be at risk for: Heart failure. Heart attack. Stroke. Type 2 diabetes or a problem with your blood sugar called insulin resistance. Follow these instructions at home: Medicines Take your medicines only as told by your health care provider. Avoid alcohol, medicines to help you relax, and certain pain medicines. These may make sleep apnea worse. General instructions Do not smoke, vape, or use products with nicotine or tobacco in them. If you need help quitting, talk with your provider. If you were given a PAP device to open your airway while you sleep, use it as told by your provider. If you're having surgery, make sure to tell your provider you have sleep apnea. You may need to bring your PAP device with you. Contact a health care provider if: The PAP device that you were given to use during sleep bothers you or does not seem to be working. You do not feel better or you feel worse. Get help right away if: You have trouble breathing. You have chest pain. You have trouble talking. One side of your body feels weak. A part of your face is hanging down. These symptoms may be an emergency. Call 911 right away. Do not wait to see if the symptoms will go away. Do not drive yourself to the hospital. This information is not intended to replace advice given to you by your  health care provider. Make sure you discuss any questions you have with your health care provider. Document Revised: 03/28/2023 Document Reviewed: 08/30/2022 Elsevier Patient Education  2024 [employer].

## 2024-02-03 NOTE — Progress Notes (Unsigned)
 [facility]    Provider:  Dr Ines Requesting Provider: Jackolyn Darice BROCKS, FNP Primary Care Provider:  Jackolyn Darice BROCKS, FNP  CC:  Headaches  HPI:  Karen Vargas is a 53 y.o. female here as requested by Jackolyn Darice BROCKS, FNP for headaches. She has a daily headache. has Carpal tunnel syndrome of right wrist; Strain of neck muscle; Preoperative cardiovascular examination; Chest tightness; Morbid obesity (HCC); DOE (dyspnea on exertion); Bilateral carotid bruits; Asthma; History of prediabetes; Malaise and fatigue; Mixed hyperlipidemia; Moderate persistent asthma; Obesity (BMI 35.0-39.9 without comorbidity); Personal history of COVID-19; Pneumonia due to COVID-19 virus; Cardiac murmur; Fibromyalgia; Osteoporosis; B12 deficiency; Chronic arthralgias of knees and hips; Long-term use of aspirin therapy; Inflammatory arthritis; Osteoarthritis; Prediabetes; Vitamin D deficiency; Hyperlipidemia; Palpitations; Coronary artery disease posterolateral branch stenosis based on coronary CT angio; Excessive daytime sleepiness; and Chronic daily headache on their problem list.  Migraines started in her mid to late 40s, she has had several falls, she has orthopaedic issues, she has fibromyalgia which has worsened over the years, her intense migraines were after a major fall 2019 were severe (worsened, had migraines before). Now she stays foggy headaed, she has no energy, she hasn't slept well in a long time, wakes up frequently, she snores, she is dull in the morning and never feels refreshed in the morning, here with her husband who provides information, after her first child she stopped breathing she woke up in her sleep like she wasn;t breathing her heart was racing and he head pounding. Wakesup nor refreshed, headaches in the morning, she keeps water by her bed and drinks it all night long. She has brain zaps in the head. She is very emotional. She did nt like Dr. Leopoldo when she saw him for headaches he  was disrespectful. She has depression. Headaches are dull. Not as many brain zaps that isn't the problem. Always on the right side. Can be pulsating/pounding/throbbing, always her right side, radiats to her neck to the right side, she can't stand lights, a dark room helps, she has sound sensitivity, nausea, no vomiting, can be moderate in severity, can last 4-24 hours, no aura, no medication oversue, ongoing > 1 year,mother has sleep apnea.   Reviewed notes, labs and imaging from outside physicians, which showed:  From a thorough review of records and patient report, Medications tried that can be used in migraine/headache management greater than 3 months include: Lifestyle modification, headache diaries, better sleep hygiene, exercise, management of migraine triggers, OTC and prescribed analgesics/nsaids such as ibuprofen, excedrin, alleve and others, cymbalta, amitriptyline/nortript**, metoprolol , topamax,imdur    06/13/2022: CLINICAL DATA:  Provided history: Loss of vision. History of migraines. Neck pain. Additional history provided by scanning technologist: Patient reports a history of migraines with visual changes.   EXAM: MRI HEAD WITHOUT AND WITH CONTRAST - reviewed images and agree with the following   TECHNIQUE: Multiplanar, multiecho pulse sequences of the brain and surrounding structures were obtained without and with intravenous contrast.   CONTRAST:  10 mL Vueway  intravenous contrast.   COMPARISON:  Brain MRI 8247 (images available, report unavailable).   FINDINGS: Brain:   Cerebral volume is normal.   There are several tiny nonspecific foci of T2 FLAIR hyperintense signal abnormality scattered within the bilateral cerebral white matter (measuring up to 3 mm).   There is no acute infarct.   No evidence of an intracranial mass.   No chronic intracranial blood products.   No extra-axial fluid collection.   No midline shift.  No pathologic intracranial enhancement  identified.  '   Vascular: Maintained flow voids within the proximal large arterial vessels. Small developmental venous anomaly within the anterior right frontal lobe (anatomic variant).   Skull and upper cervical spine: No focal suspicious marrow lesion.   Sinuses/Orbits: No mass or acute finding within the imaged orbits. Trace mucosal thickening within the bilateral ethmoid air cells.   IMPRESSION: 1.  No evidence of acute intracranial abnormality. 2. Several tiny nonspecific T2 FLAIR hyperintense remote insults within the cerebral white matter, new from the prior brain MRI of 03/01/2006. 3. Otherwise unremarkable MRI appearance the brain.     Review of Systems: Patient complains of symptoms per HPI as well as the following symptoms none. Pertinent negatives and positives per HPI. All others negative.   Social History   Socioeconomic History   Marital status: Married    Spouse name: Not on file   Number of children: Not on file   Years of education: Not on file   Highest education level: Not on file  Occupational History   Not on file  Tobacco Use   Smoking status: Never   Smokeless tobacco: Never  Vaping Use   Vaping status: Never Used  Substance and Sexual Activity   Alcohol use: Yes    Comment: rare   Drug use: Never   Sexual activity: Not on file  Other Topics Concern   Not on file  Social History Narrative   Caffiene tea , no soda   Working no   Live husband 2 daughter and grandson, 1 dog (inside).   Social Drivers of Corporate investment banker Strain: Not on file  Food Insecurity: Low Risk  (03/18/2023)   Received from [facility]   Hunger Vital Sign    Within the past 12 months, you worried that your food would run out before you got money to buy more: Never true    Within the past 12 months, the food you bought just didn't last and you didn't have money to get more. : Never true  Transportation Needs: No Transportation Needs (03/18/2023)   Received  from [facility]    In the past 12 months, has lack of reliable transportation kept you from medical appointments, meetings, work or from getting things needed for daily living? : No  Physical Activity: Not on file  Stress: Not on file  Social Connections: Unknown (11/17/2021)   [facility] from [facility]   Social Network    Social Network: Not on file  Intimate Partner Violence: Unknown (10/10/2021)   Received from [facility]   HITS    Physically Hurt: Not on file    Insult or Talk Down To: Not on file    Threaten Physical Harm: Not on file    Scream or Curse: Not on file    Family History  Problem Relation Age of Onset   Hypertension Mother    Diabetes Mother    Stroke Mother    Heart failure Mother    Hypertension Father    Atrial fibrillation Father    Hypertension Sister    Cancer Paternal Aunt    Diabetes Maternal Grandmother    Heart attack Paternal Grandmother    Heart attack Paternal Grandfather    Migraines Neg Hx     Past Medical History:  Diagnosis Date   Asthma    B12 deficiency 07/20/2020   Bilateral carotid bruits 10/26/2019   Cardiac murmur    Carotid artery  stenosis 2021   Mild, bilateral   Carpal tunnel syndrome of right wrist 01/01/2019   Chest tightness 01/26/2019   Chronic arthralgias of knees and hips 07/20/2020   Coronary artery disease 2024   Right coronary artery atheromatous vascular disease noted on CT of the abdomen   DOE (dyspnea on exertion) 10/26/2019   Falls    hurt neck, shoulder   Fibromyalgia    History of prediabetes 10/20/2019   Hyperlipidemia 09/28/2020   Inflammatory arthritis 09/28/2020   Long-term use of aspirin therapy 07/20/2020   Malaise and fatigue 10/20/2019   Migraines    Mixed hyperlipidemia 10/20/2019   Moderate persistent asthma 10/20/2019   Morbid obesity (HCC) 01/26/2019   Obesity (BMI 35.0-39.9 without comorbidity) 01/27/2020   Osteoarthritis 09/28/2020   Osteoporosis     Palpitations 09/28/2020   Personal history of COVID-19 10/20/2019   Formatting of this note might be different from the original. 06/14/19   Pneumonia due to COVID-19 virus 06/14/2019   Prediabetes 09/28/2020   Preoperative cardiovascular examination 01/26/2019   Strain of neck muscle 01/01/2019   Vitamin D deficiency 09/28/2020    Patient Active Problem List   Diagnosis Date Noted   Excessive daytime sleepiness 02/04/2024   Chronic daily headache 02/04/2024   Coronary artery disease posterolateral branch stenosis based on coronary CT angio 01/30/2024   Inflammatory arthritis 09/28/2020   Osteoarthritis 09/28/2020   Prediabetes 09/28/2020   Vitamin D deficiency 09/28/2020   Hyperlipidemia 09/28/2020   Palpitations 09/28/2020   Cardiac murmur    Fibromyalgia    Osteoporosis    B12 deficiency 07/20/2020   Chronic arthralgias of knees and hips 07/20/2020   Long-term use of aspirin therapy 07/20/2020   Asthma 01/27/2020   Obesity (BMI 35.0-39.9 without comorbidity) 01/27/2020   DOE (dyspnea on exertion) 10/26/2019   Bilateral carotid bruits 10/26/2019   History of prediabetes 10/20/2019   Malaise and fatigue 10/20/2019   Mixed hyperlipidemia 10/20/2019   Moderate persistent asthma 10/20/2019   Personal history of COVID-19 10/20/2019   Pneumonia due to COVID-19 virus 06/14/2019   Preoperative cardiovascular examination 01/26/2019   Chest tightness 01/26/2019   Morbid obesity (HCC) 01/26/2019   Carpal tunnel syndrome of right wrist 01/01/2019   Strain of neck muscle 01/01/2019    Past Surgical History:  Procedure Laterality Date   ABDOMINAL HYSTERECTOMY     APPENDECTOMY     CHOLECYSTECTOMY     HAND SURGERY Right    KNEE SURGERY      Current Outpatient Medications  Medication Sig Dispense Refill   Abatacept (ORENCIA IV) Inject 1,000 mg into the vein every 30 (thirty) days.     albuterol (PROAIR HFA) 108 (90 Base) MCG/ACT inhaler Inhale 2 puffs into the lungs as needed  for wheezing or shortness of breath.     cephALEXin (KEFLEX) 250 MG capsule Take 250 mg by mouth 4 (four) times daily. Tx complete on 02/06/2024     clopidogrel  (PLAVIX ) 75 MG tablet Take 1 tablet (75 mg total) by mouth daily. 90 tablet 3   isosorbide  mononitrate (IMDUR ) 30 MG 24 hr tablet Take 1 tablet (30 mg total) by mouth daily. 90 tablet 3   metoprolol  succinate (TOPROL -XL) 25 MG 24 hr tablet Take 1 tablet by mouth daily.     nitroGLYCERIN  (NITROSTAT ) 0.4 MG SL tablet Place 1 tablet (0.4 mg total) under the tongue every 5 (five) minutes as needed for chest pain. 25 tablet 3   rosuvastatin  (CRESTOR ) 20 MG tablet Take 1 tablet (  20 mg total) by mouth daily. 90 tablet 3   VITAMIN D, CHOLECALCIFEROL, PO Take by mouth.     No current facility-administered medications for this visit.    Allergies as of 02/03/2024 - Review Complete 02/03/2024  Allergen Reaction Noted   Ciprofloxacin Diarrhea 06/14/2019   Haemophilus b polysaccharide vaccine Hives 06/14/2019   Amoxicillin-pot clavulanate  09/19/2020   Aspirin Other (See Comments) 09/13/2023   Doxycycline Nausea And Vomiting 10/29/2017   Fluzone quadrivalent [influenza vac split quad]  09/19/2020   Gabapentin  01/26/2019   Influenza vaccines Hives 10/29/2017   Meloxicam  09/19/2020   Penicillin g  09/19/2020   Prednisone  01/26/2019   Tramadol Rash 10/29/2017    Vitals: BP 131/74 (Cuff Size: Large)   Pulse (!) 52   Ht 5' 4 (1.626 m)   Wt 248 lb 6.4 oz (112.7 kg)   BMI 42.64 kg/m  Last Weight:  Wt Readings from Last 1 Encounters:  02/03/24 248 lb 6.4 oz (112.7 kg)   Last Height:   Ht Readings from Last 1 Encounters:  02/03/24 5' 4 (1.626 m)     Physical exam: Exam: Gen: NAD, conversant, well nourised, obese, well groomed                     CV: RRR, no MRG. No Carotid Bruits. No peripheral edema, warm, nontender Eyes: Conjunctivae clear without exudates or hemorrhage  Neuro: Detailed Neurologic Exam  Speech:     Speech is normal; fluent and spontaneous with normal comprehension.  Cognition:    The patient is oriented to person, place, and time;     recent and remote memory intact;     language fluent;     normal attention, concentration,     fund of knowledge Cranial Nerves:    The pupils are equal, round, and reactive to light. The fundi are normal and spontaneous venous pulsations are present. Visual fields are full to finger confrontation. Extraocular movements are intact. Trigeminal sensation is intact and the muscles of mastication are normal. The face is symmetric. The palate elevates in the midline. Hearing intact. Voice is normal. Shoulder shrug is normal. The tongue has normal motion without fasciculations.   Coordination: normal  Gait: normal  Motor Observation:    No asymmetry, no atrophy, and no involuntary movements noted. Tone:    Normal muscle tone.    Posture:    Posture is normal. normal erect    Strength:    Strength is V/V in the upper and lower limbs.      Sensation: intact to LT     Reflex Exam:  DTR's:    Deep tendon reflexes in the upper and lower extremities are symmetrical bilaterally.   Toes:    The toes are downgoing bilaterally.   Clonus:    Clonus is absent.    Assessment/Plan:  Patient with headaches.  She has a history of migraines however these headaches are different and she complains of morning headaches, feeling foggy headed, no energy, not sleeping well in a very long time, wakes up frequently, snoring, dull in the morning and never feels refreshed in the morning, keeps water by her bed and drinks it all night long, we discussed migraines and treatment for migraines however patient feels as though this is different and she feels her biggest complaint is her fatigue and sleeping issues so at this time we decided to send her for sleep evaluation/sleep study.  Will refer to sleep team (return  to dr ines if needed after sleep evaluation for heaaches);  ESS 15   Orders Placed This Encounter  Procedures   TSH Rfx on Abnormal to Free T4   Ambulatory referral to Sleep Studies    Cc: Goins, Darice BROCKS, FNP,  Goins, Darice BROCKS, FNP  Onetha ines, MD  [facility] [address] [place], [place] [address]  Phone [phone] Fax [phone]

## 2024-02-04 ENCOUNTER — Encounter: Payer: Self-pay | Admitting: Neurology

## 2024-02-04 DIAGNOSIS — R519 Headache, unspecified: Secondary | ICD-10-CM | POA: Insufficient documentation

## 2024-02-04 DIAGNOSIS — G4719 Other hypersomnia: Secondary | ICD-10-CM | POA: Insufficient documentation

## 2024-03-13 ENCOUNTER — Emergency Department (HOSPITAL_BASED_OUTPATIENT_CLINIC_OR_DEPARTMENT_OTHER): Admitting: Radiology

## 2024-03-13 ENCOUNTER — Encounter (HOSPITAL_BASED_OUTPATIENT_CLINIC_OR_DEPARTMENT_OTHER): Payer: Self-pay | Admitting: Emergency Medicine

## 2024-03-13 ENCOUNTER — Emergency Department (HOSPITAL_BASED_OUTPATIENT_CLINIC_OR_DEPARTMENT_OTHER)
Admission: EM | Admit: 2024-03-13 | Discharge: 2024-03-13 | Disposition: A | Discharge status: Home / Self Care | Source: Ambulatory Visit | Attending: Emergency Medicine | Admitting: Emergency Medicine

## 2024-03-13 ENCOUNTER — Emergency Department (HOSPITAL_BASED_OUTPATIENT_CLINIC_OR_DEPARTMENT_OTHER)

## 2024-03-13 ENCOUNTER — Other Ambulatory Visit: Payer: Self-pay

## 2024-03-13 DIAGNOSIS — Z7901 Long term (current) use of anticoagulants: Secondary | ICD-10-CM | POA: Insufficient documentation

## 2024-03-13 DIAGNOSIS — I2510 Atherosclerotic heart disease of native coronary artery without angina pectoris: Secondary | ICD-10-CM | POA: Diagnosis not present

## 2024-03-13 DIAGNOSIS — I10 Essential (primary) hypertension: Secondary | ICD-10-CM | POA: Insufficient documentation

## 2024-03-13 DIAGNOSIS — K529 Noninfective gastroenteritis and colitis, unspecified: Secondary | ICD-10-CM | POA: Diagnosis not present

## 2024-03-13 DIAGNOSIS — R1012 Left upper quadrant pain: Secondary | ICD-10-CM | POA: Diagnosis present

## 2024-03-13 DIAGNOSIS — E119 Type 2 diabetes mellitus without complications: Secondary | ICD-10-CM | POA: Diagnosis not present

## 2024-03-13 LAB — COMPREHENSIVE METABOLIC PANEL WITH GFR
ALT: 21 U/L (ref 0–44)
AST: 25 U/L (ref 15–41)
Albumin: 4.2 g/dL (ref 3.5–5.0)
Alkaline Phosphatase: 103 U/L (ref 38–126)
Anion gap: 15 (ref 5–15)
BUN: 14 mg/dL (ref 6–20)
CO2: 22 mmol/L (ref 22–32)
Calcium: 10 mg/dL (ref 8.9–10.3)
Chloride: 103 mmol/L (ref 98–111)
Creatinine, Ser: 1.16 mg/dL — ABNORMAL HIGH (ref 0.44–1.00)
GFR, Estimated: 56 mL/min — ABNORMAL LOW (ref >60)
Glucose, Bld: 102 mg/dL — ABNORMAL HIGH (ref 70–99)
Potassium: 3.9 mmol/L (ref 3.5–5.1)
Sodium: 140 mmol/L (ref 135–145)
Total Bilirubin: 0.9 mg/dL (ref 0.0–1.2)
Total Protein: 7.1 g/dL (ref 6.5–8.1)

## 2024-03-13 LAB — URINALYSIS, ROUTINE W REFLEX MICROSCOPIC
Bilirubin Urine: NEGATIVE
Glucose, UA: NEGATIVE mg/dL
Hgb urine dipstick: NEGATIVE
Ketones, ur: NEGATIVE mg/dL
Leukocytes,Ua: NEGATIVE
Nitrite: NEGATIVE
Protein, ur: NEGATIVE mg/dL
Specific Gravity, Urine: 1.006 (ref 1.005–1.030)
pH: 7 (ref 5.0–8.0)

## 2024-03-13 LAB — LIPASE, BLOOD: Lipase: 38 U/L (ref 11–51)

## 2024-03-13 LAB — TROPONIN T, HIGH SENSITIVITY: Troponin T High Sensitivity: <15 ng/L (ref 0–19)

## 2024-03-13 LAB — CBC
HCT: 42.7 % (ref 36.0–46.0)
Hemoglobin: 14.7 g/dL (ref 12.0–15.0)
MCH: 28.9 pg (ref 26.0–34.0)
MCHC: 34.4 g/dL (ref 30.0–36.0)
MCV: 84.1 fL (ref 80.0–100.0)
Platelets: 178 K/uL (ref 150–400)
RBC: 5.08 MIL/uL (ref 3.87–5.11)
RDW: 13.5 % (ref 11.5–15.5)
WBC: 6.5 K/uL (ref 4.0–10.5)
nRBC: 0 % (ref 0.0–0.2)

## 2024-03-13 LAB — PRO BRAIN NATRIURETIC PEPTIDE: Pro Brain Natriuretic Peptide: 80.6 pg/mL (ref <300.0)

## 2024-03-13 LAB — D-DIMER, QUANTITATIVE: D-Dimer, Quant: 0.51 ug{FEU}/mL — ABNORMAL HIGH (ref 0.00–0.50)

## 2024-03-13 MED ORDER — PANTOPRAZOLE SODIUM 40 MG IV SOLR
40.0000 mg | Freq: Once | INTRAVENOUS | Status: AC
  Administered 2024-03-13: 40 mg via INTRAVENOUS
  Filled 2024-03-13: qty 10

## 2024-03-13 MED ORDER — IOHEXOL 300 MG/ML  SOLN
100.0000 mL | Freq: Once | INTRAMUSCULAR | Status: AC | PRN
  Administered 2024-03-13: 100 mL via INTRAVENOUS

## 2024-03-13 NOTE — ED Notes (Signed)
 Pt stated she has had 3 COVID test done recently and all came back neg. Pt refused the COVID test that was ordered today. Provider was made aware.

## 2024-03-13 NOTE — Discharge Instructions (Addendum)
 Your CT scan today showed some dilated loops of bowel in the upper left part of your abdomen where you are having pain.  This is thought to be a possible mild inflammation of the bowel versus slow transit in this area.  There are no signs of obstruction. I have included your CT scan results below for your reference  As discussed, we need to allow your bowel to rest.  Please maintain a clear liquid diet for the next 24 to 48 hours consisting of broths, Gatorade, fruit juice without polyp.  You may then advance your diet slowly after this is tolerated with food such as soups, smoothies, toast, rice.  You may take up to 1000mg  of tylenol every 6 hours as needed for pain.  Do not take more then 4g per day.  Please follow-up with your gastroenterologist within the next 1 to 2 weeks for recheck of symptoms.  The remainder of your labs are reassuring.  Your kidney lab, your creatinine, was slightly elevated today, but this has been elevated previously.  Please have your PCP continue to monitor this.  Your liver and pancreas labs are normal.  Your blood counts are normal.  It is very unlikely that your pain is due to an issue in your heart. Your cardiac enzyme (troponin) was normal today. Your EKG which is a measure of the heart's electrical activity and rhythm is normal today. These would both show abnormalities if you were having a heart attack. Your lab test to look for any signs of a blood clot is normal for your age, a blood clot is very unlikely  Your chest x-ray is normal today.  Please return to the ER for worsening abdominal pain, persistent vomiting, fevers, if you are no longer having any bowel movements or passing gas, chest pain, worsening shortness of breath, any other new or concerning symptoms   EXAM:  CT ABDOMEN AND PELVIS WITH CONTRAST    TECHNIQUE:  Multidetector CT imaging of the abdomen and pelvis was performed  using the standard protocol following bolus administration of   intravenous contrast.    RADIATION DOSE REDUCTION: This exam was performed according to the  departmental dose-optimization program which includes automated  exposure control, adjustment of the mA and/or kV according to  patient size and/or use of iterative reconstruction technique.    CONTRAST:  OMNIPAQUE  IOHEXOL  300 MG/ML  SOLN    COMPARISON:  CT 03/20/2023    FINDINGS:  Lower chest: Lung bases demonstrate atelectasis or scarring in the  subpleural lower lobes. No consolidation or effusion.    Hepatobiliary: Hepatic steatosis. Cholecystectomy. No biliary  dilatation    Pancreas: Unremarkable. No pancreatic ductal dilatation or  surrounding inflammatory changes.    Spleen: Normal in size without focal abnormality.    Adrenals/Urinary Tract: Adrenal glands are normal. No  hydronephrosis. Small cyst at the lower pole left kidney, no imaging  follow-up is recommended. Bladder is normal    Stomach/Bowel: Stomach nonenlarged. A few mildly distended small  bowel loops in the left upper quadrant with scattered fluid-filled  nondilated small bowel elsewhere. No acute bowel wall thickening.  Mild diverticular disease of the left colon. Appendectomy.    Vascular/Lymphatic: No significant vascular findings are present. No  enlarged abdominal or pelvic lymph nodes.    Reproductive: Status post hysterectomy. No adnexal masses.    Other: Negative for pelvic effusion or free air    Musculoskeletal: No acute or suspicious osseous abnormality    IMPRESSION:  1.  A few mildly distended small bowel loops in the left upper  quadrant with scattered fluid-filled nondilated small bowel  elsewhere. Findings are nonspecific but could be seen in the setting  of enteritis or mild ileus., no convincing obstruction.  2. Hepatic steatosis.  3. Mild diverticular disease of the left colon without acute  inflammatory process.      Electronically Signed    By: Luke Bun M.D.    On:  03/13/2024 16:55

## 2024-03-13 NOTE — ED Triage Notes (Signed)
 Starting last Tuesday after infusion Felt swollen and bloated  Sob LUQ

## 2024-03-13 NOTE — ED Provider Notes (Signed)
 [place] [facility]ERGENCY DEPARTMENT AT [facility] Provider Note   CSN: [id number] Arrival date & time: 03/13/24  1251     Patient presents with: Abdominal Pain   Karen Vargas is a 53 y.o. female with history of RA on Orencia infusion, hypertension, type II diabetes, CAD on Plavix , obesity, who presents with concern for body aches since her Orencia infusion last Wednesday, 8/27.  States the body aches were all over.  She then developed left upper quadrant abdominal pain that seems fairly constant.  Reports pain seems to worsen when she is up moving around, but better when lying still.  She also reports some associated shortness of breath.  Denies any nausea, vomiting, cough, chest pain, fevers, or chills.  Reports normal bowel movements.    Abdominal Pain      Prior to Admission medications   Medication Sig Start Date End Date Taking? Authorizing Provider  Abatacept (ORENCIA IV) Inject 1,000 mg into the vein every 30 (thirty) days.    [provider]  albuterol (PROAIR HFA) 108 (90 Base) MCG/ACT inhaler Inhale 2 puffs into the lungs as needed for wheezing or shortness of breath.    [provider]  cephALEXin (KEFLEX) 250 MG capsule Take 250 mg by mouth 4 (four) times daily. Tx complete on 02/06/2024    [provider]  clopidogrel  (PLAVIX ) 75 MG tablet Take 1 tablet (75 mg total) by mouth daily. 09/06/23   Carlin Delon BROCKS, NP  isosorbide  mononitrate (IMDUR ) 30 MG 24 hr tablet Take 1 tablet (30 mg total) by mouth daily. 09/06/23 02/03/24  Carlin Delon BROCKS, NP  metoprolol  succinate (TOPROL -XL) 25 MG 24 hr tablet Take 1 tablet by mouth daily. 09/09/23   [provider]  nitroGLYCERIN  (NITROSTAT ) 0.4 MG SL tablet Place 1 tablet (0.4 mg total) under the tongue every 5 (five) minutes as needed for chest pain. 08/05/23 02/03/24  Carlin Delon BROCKS, NP  rosuvastatin  (CRESTOR ) 20 MG tablet Take 1 tablet (20 mg total) by mouth daily. 08/08/23 02/03/24  Carlin Delon BROCKS, NP  VITAMIN D, CHOLECALCIFEROL, PO Take by mouth.    [provider]    Allergies: Ciprofloxacin, Haemophilus b polysaccharide vaccine, Amoxicillin-pot clavulanate, Aspirin, Doxycycline, Fluzone quadrivalent [influenza vac split quad], Gabapentin, Influenza vaccines, Meloxicam, Penicillin g, Prednisone, and Tramadol    Review of Systems  Gastrointestinal:  Positive for abdominal pain.    Updated Vital Signs BP 96/65   Pulse 79   Temp 98 F (36.7 C) (Oral)   Resp (!) 22   SpO2 98%   Physical Exam Vitals and nursing note reviewed.  Constitutional:      General: She is not in acute distress.    Appearance: She is well-developed. She is obese.  HENT:     Head: Normocephalic and atraumatic.  Eyes:     Conjunctiva/sclera: Conjunctivae normal.  Cardiovascular:     Rate and Rhythm: Normal rate and regular rhythm.     Heart sounds: No murmur heard.    Comments: 2+ pedal and radial pulses bilaterally Pulmonary:     Effort: Pulmonary effort is normal. No respiratory distress.     Breath sounds: Normal breath sounds.     Comments: When talking, seems short of breath, but talking in full sentences.  Lungs clear to auscultation bilaterally Abdominal:     Palpations: Abdomen is soft.     Tenderness: There is abdominal tenderness.     Comments: Mild tenderness in the left upper quadrant without any rebound or  guarding.  No overlying skin change.  No hernias appreciated.  Musculoskeletal:        General: No swelling.     Cervical back: Neck supple.     Right lower leg: No edema.     Left lower leg: No edema.     Comments: Calves bilaterally soft and nontender to palpation  Skin:    General: Skin is warm and dry.     Capillary Refill: Capillary refill takes less than 2 seconds.  Neurological:     Mental Status: She is alert.  Psychiatric:        Mood and Affect: Mood normal.     (all labs ordered are listed, but only abnormal results are displayed) Labs  Reviewed  COMPREHENSIVE METABOLIC PANEL WITH GFR - Abnormal; Notable for the following components:      Result Value   Glucose, Bld 102 (*)    Creatinine, Ser 1.16 (*)    GFR, Estimated 56 (*)    All other components within normal limits  URINALYSIS, ROUTINE W REFLEX MICROSCOPIC - Abnormal; Notable for the following components:   Color, Urine COLORLESS (*)    All other components within normal limits  D-DIMER, QUANTITATIVE - Abnormal; Notable for the following components:   D-Dimer, Quant 0.51 (*)    All other components within normal limits  LIPASE, BLOOD  CBC  PRO BRAIN NATRIURETIC PEPTIDE  TROPONIN T, HIGH SENSITIVITY    EKG: None  Radiology: DG Chest 2 View Result Date: 03/13/2024 CLINICAL DATA:  Shortness of breath EXAM: CHEST - 2 VIEW COMPARISON:  03/10/2024 FINDINGS: The heart size and mediastinal contours are within normal limits. Both lungs are clear. The visualized skeletal structures are unremarkable. IMPRESSION: No active cardiopulmonary disease. Electronically Signed   By: Luke Bun M.D.   On: 03/13/2024 16:55   CT ABDOMEN PELVIS W CONTRAST Result Date: 03/13/2024 CLINICAL DATA:  Left upper quadrant pain EXAM: CT ABDOMEN AND PELVIS WITH CONTRAST TECHNIQUE: Multidetector CT imaging of the abdomen and pelvis was performed using the standard protocol following bolus administration of intravenous contrast. RADIATION DOSE REDUCTION: This exam was performed according to the departmental dose-optimization program which includes automated exposure control, adjustment of the mA and/or kV according to patient size and/or use of iterative reconstruction technique. CONTRAST:  OMNIPAQUE  IOHEXOL  300 MG/ML  SOLN COMPARISON:  CT 03/20/2023 FINDINGS: Lower chest: Lung bases demonstrate atelectasis or scarring in the subpleural lower lobes. No consolidation or effusion. Hepatobiliary: Hepatic steatosis. Cholecystectomy. No biliary dilatation Pancreas: Unremarkable. No pancreatic ductal  dilatation or surrounding inflammatory changes. Spleen: Normal in size without focal abnormality. Adrenals/Urinary Tract: Adrenal glands are normal. No hydronephrosis. Small cyst at the lower pole left kidney, no imaging follow-up is recommended. Bladder is normal Stomach/Bowel: Stomach nonenlarged. A few mildly distended small bowel loops in the left upper quadrant with scattered fluid-filled nondilated small bowel elsewhere. No acute bowel wall thickening. Mild diverticular disease of the left colon. Appendectomy. Vascular/Lymphatic: No significant vascular findings are present. No enlarged abdominal or pelvic lymph nodes. Reproductive: Status post hysterectomy. No adnexal masses. Other: Negative for pelvic effusion or free air Musculoskeletal: No acute or suspicious osseous abnormality IMPRESSION: 1. A few mildly distended small bowel loops in the left upper quadrant with scattered fluid-filled nondilated small bowel elsewhere. Findings are nonspecific but could be seen in the setting of enteritis or mild ileus., no convincing obstruction. 2. Hepatic steatosis. 3. Mild diverticular disease of the left colon without acute inflammatory process. Electronically Signed  By: Luke Bun M.D.   On: 03/13/2024 16:55     Procedures   Medications Ordered in the ED  pantoprazole  (PROTONIX ) injection 40 mg (40 mg Intravenous Given 03/13/24 1441)  iohexol  (OMNIPAQUE ) 300 MG/ML solution 100 mL (100 mLs Intravenous Contrast Given 03/13/24 1634)                                    Medical Decision Making Amount and/or Complexity of Data Reviewed Labs: ordered. Radiology: ordered.  Risk Prescription drug management.     Differential diagnosis includes but is not limited to ACS, arrhythmia, aortic aneurysm, pericarditis, myocarditis, pericardial effusion, cardiac tamponade, musculoskeletal pain, GERD, Boerhaave's syndrome, DVT/PE, pneumonia, pleural effusion, constipation, gastric ulcer, gastritis   ED  Course:  Upon initial evaluation, patient is well-appearing, no acute distress.  Stable vitals.  Reporting shortness of breath, but is talking in full sentences on room air without difficulty.  She is maintaining at 100% oxygen saturation on room air.  Lungs clear to auscultation bilaterally.  Also reporting some left upper quadrant abdominal pain.  Left upper quadrant with mild tenderness, but no rebound or guarding.   Labs Ordered: I Ordered, and personally interpreted labs.  The pertinent results include:   CBC within normal limits CMP with mildly elevated creatinine 1.16, this is up from 1.124 months ago.  Normal LFTs and electrolytes Lipase within normal limits D-dimer within normal limits for age Urinalysis without any signs of infection Troponin within normal limits BNP within normal limits  Imaging Studies ordered: I ordered imaging studies including chest x-ray, CT abdomen and pelvis I independently visualized the imaging with scope of interpretation limited to determining acute life threatening conditions related to emergency care. Imaging showed  Chest x-ray without any acute abnormalities CT abdomen pelvis IMPRESSION: 1. A few mildly distended small bowel loops in the left upper quadrant with scattered fluid-filled nondilated small bowel elsewhere. Findings are nonspecific but could be seen in the setting of enteritis or mild ileus., no convincing obstruction. 2. Hepatic steatosis. 3. Mild diverticular disease of the left colon without acute inflammatory process. I agree with the radiologist interpretation   Cardiac Monitoring: / EKG: The patient was maintained on a cardiac monitor.  I personally viewed and interpreted the cardiac monitored which showed an underlying rhythm of: Normal sinus rhythm   Medications Given: Protonix   Upon re-evaluation, patient remains well-appearing with stable vitals.  Reports the Protonix  did not help pain.  Low concern for ACS at this  time given troponin under 15, pain non-exertional and more abdominal pain rather than chest pain, and pain ongoing for over a week, and EKG with normal sinus rhythm and no ST changes. Chest x-ray without any acute abnormality. No concern for DVT or PE at this time given d-dimer within normal limits when age adjusted.  Her labs are reassuring with CBC within normal limits.  CMP with mildly elevated creatinine, but appears to be at baseline.  No electrolyte abnormalities or elevations in LFTs.  Lipase within normal limits.  Her CT scan shows a few mildly distended small bowel loops in the left upper quadrant which is where she is reporting her pain.  This is nonspecific, but to be possible enteritis versus mild ileus.  No signs of obstruction on CT, I have low concern for SBO given she has been able to tolerate p.o. intake and has been having bowel movements.  She does not  have any fevers, tachycardia, or leukocytosis to suggest a systemic infection.  Will treat the ileus versus enteritis findings on CT with liquid diet/bowel rest at home.  She states she has a gastroenterologist that she can follow-up with.  Stable and appropriate for discharge home this time.    Impression: Left upper quadrant pain, likely enteritis vs ileus   Disposition:  The patient was discharged home with instructions to clear liquid diet for the next 24 to 48 hours, then advance as tolerated.  Follow-up with her gastroenterologist within the next 1 to 2 weeks.  Follow-up with her PCP regarding her elevated creatinine here today. Return precautions given.    Record Review: External records from outside source obtained and reviewed including  Cardiology note from 01/30/24:  CT angio done which showed calcium  score of 436 which is 99th percentile, FFR showed flow-limiting stenosis in the posterior lateral branch      This chart was dictated using voice recognition software, Dragon. Despite the best efforts of this provider to  proofread and correct errors, errors may still occur which can change documentation meaning.       Final diagnoses:  Enteritis    ED Discharge Orders     None          Veta Palma, [place] 03/13/24 1737    Simon Lavonia SAILOR, MD 03/14/24 [phone]

## 2024-03-16 ENCOUNTER — Encounter: Payer: Self-pay | Admitting: Neurology

## 2024-03-16 ENCOUNTER — Institutional Professional Consult (permissible substitution): Admitting: Neurology

## 2024-03-23 ENCOUNTER — Telehealth: Payer: Self-pay | Admitting: Neurology

## 2024-03-23 NOTE — Telephone Encounter (Signed)
 Pt has called to report she left a message to avoid appointment showing as a no show, she has r/s the appointment.

## 2024-04-27 ENCOUNTER — Ambulatory Visit (INDEPENDENT_AMBULATORY_CARE_PROVIDER_SITE_OTHER): Admitting: Neurology

## 2024-04-27 ENCOUNTER — Encounter: Payer: Self-pay | Admitting: Neurology

## 2024-04-27 VITALS — BP 145/81 | HR 69 | Ht 63.5 in | Wt 244.0 lb

## 2024-04-27 DIAGNOSIS — G4719 Other hypersomnia: Secondary | ICD-10-CM | POA: Diagnosis not present

## 2024-04-27 DIAGNOSIS — G9332 Myalgic encephalomyelitis/chronic fatigue syndrome: Secondary | ICD-10-CM | POA: Diagnosis not present

## 2024-04-27 DIAGNOSIS — R519 Headache, unspecified: Secondary | ICD-10-CM

## 2024-04-27 NOTE — Progress Notes (Signed)
 @[facility]   Provider:  Dedra Gores, MD    Primary Care Physician:  Jackolyn Darice BROCKS, FNP [phone] [address] [place] [address]   Referring Provider: Ines Onetha NOVAK, Md [address] [place],  [place] [address]        Chief Concern for this Consultation:   Patient presents with          HPI: I have the pleasure of meeting with Karen Vargas , on 04/27/24 , who is a 53 y.o.  Karen patient,  seen upon a referral by dr Ines for a  Sleep Medicine Consultation.  The patient's referral information asked for a work up of possible apnea or hypoxia.    Chief concern according to patient:  I had seen Dr Ines who wanted me to undergo sleep testing ,I have pre damaged lungs,  I was hospitalized with Covid 2020 and was hospitalized in [facility]. I have chronic daily headaches that have improved in frequency and intensity. I use a lot of caffeine, I wake now up with headaches, Dr Leopoldo at [facility] topiramate which I don't take now . Some wake me up out of sleep.    Headaches, per Dr Ines.    HPI:  Karen Vargas is a 53 y.o. Karen here as requested by Jackolyn Darice BROCKS, FNP for headaches. She has a daily headache. has Carpal tunnel syndrome of right wrist; Strain of neck muscle; Preoperative cardiovascular examination; Chest tightness; Morbid obesity (HCC); DOE (dyspnea on exertion); Bilateral carotid bruits; Asthma; History of prediabetes; Malaise and fatigue; Mixed hyperlipidemia; Moderate persistent asthma; Obesity (BMI 35.0-39.9 without comorbidity); Personal history of COVID-19; Pneumonia due to COVID-19 virus; Cardiac murmur; Fibromyalgia; Osteoporosis; B12 deficiency; Chronic arthralgias of knees and hips; Long-term use of aspirin therapy; Inflammatory arthritis; Osteoarthritis; Prediabetes; Vitamin D deficiency; Hyperlipidemia; Palpitations; Coronary artery disease posterolateral branch stenosis based on coronary CT angio; Excessive daytime sleepiness; and  Chronic daily headache on their problem list.   Migraines started in her mid to late 40s, she has had several falls, she has orthopaedic issues, she has fibromyalgia which has worsened over the years, her intense migraines were after a major fall 2019 were severe (worsened, had migraines before). Now she stays foggy headaed, she has no energy, she hasn't slept well in a long time, wakes up frequently, she snores, she is dull in the morning and never feels refreshed in the morning, here with her husband who provides information, after her first child she stopped breathing she woke up in her sleep like she wasn;t breathing her heart was racing and he head pounding. Wakesup nor refreshed, headaches in the morning, she keeps water by her bed and drinks it all night long. She has brain zaps in the head. She is very emotional. She did nt like Dr. Leopoldo when she saw him for headaches he was disrespectful. She has depression. Headaches are dull. Not as many brain zaps that isn't the problem. Always on the right side. Can be pulsating/pounding/throbbing, always her right side, radiats to her neck to the right side, she can't stand lights, a dark room helps, she has sound sensitivity, nausea, no vomiting, can be moderate in severity, can last 4-24 hours, no aura, no medication oversue, ongoing > 1 year,mother has sleep apnea.    Reviewed notes, labs and imaging from outside physicians, which showed:   From a thorough review of records and patient report, Medications tried that can be used in  migraine/headache management greater than 3 months include: Lifestyle modification, headache diaries, better sleep hygiene, exercise, management of migraine triggers, OTC and prescribed analgesics/nsaids such as ibuprofen, excedrin, alleve and others, cymbalta, amitriptyline/nortript**, metoprolol , topamax,imdur         Karen Vargas  presented with a medical history of :   has a past medical history of Asthma, B12 deficiency  (07/20/2020), Bilateral carotid bruits (10/26/2019), Cardiac murmur, Carotid artery stenosis (2021), Carpal tunnel syndrome of right wrist (01/01/2019), Chest tightness (01/26/2019), Chronic arthralgias of knees and hips (07/20/2020), Coronary artery disease (2024), DOE (dyspnea on exertion) (10/26/2019), Falls, Fibromyalgia, History of prediabetes (10/20/2019), Hyperlipidemia (09/28/2020), Inflammatory arthritis (09/28/2020), Long-term use of aspirin therapy (07/20/2020), Malaise and fatigue (10/20/2019), Migraines, Mixed hyperlipidemia (10/20/2019), Moderate persistent asthma (10/20/2019), Morbid obesity (HCC) (01/26/2019), Obesity (BMI 35.0-39.9 without comorbidity) (01/27/2020), Osteoarthritis (09/28/2020), Osteoporosis, Palpitations (09/28/2020), Personal history of COVID-19 (10/20/2019), Pneumonia due to COVID-19 virus (06/14/2019), Prediabetes (09/28/2020), Preoperative cardiovascular examination (01/26/2019), Strain of neck muscle (01/01/2019), and Vitamin D deficiency (09/28/2020)..     No ENT surgery , has Sinusitis, Trauma after a fall , whiplash,   Posttraumatic HA,  GERD,  pre-diabetic / endocrinological disorders,  Mood disorder:, Depression.  The patient had no previous sleep evaluations.   Family medical history: There are  biological family members affected by Sleep apnea ( mother , Maternal uncle and aunt ).      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 Depressed.   She lives in a private home, in a household with  husband, 2 daughters and one grandchild ( 5) and  one dog. ). Caffeine intake in form of: Coffee ( frappe / week ), Soft drinks (/), Tea ( black tea- ) no Energy drinks ( including those containing  taurine ).  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  Nicotine and alcohol are not used. Caffeine is last consumed at 3 Pm .      Sleep habits and routines are as follows: The  patient's dinner time is around 5 PM.  . The patient goes to bed at, or close to, 10.30  PM. The bedroom is shared with spouse who reports her loud snoring.  Bedroom  is described as cool, quiet, and dark. If I can't go to sleep I get on my phone . My husband snores.  The patient reports that it takes 60 minutes to fall asleep, then continues to sleep for 3-6 hours, uninterrupted or woken up by  headaches ,   diffuse pains ( fibromyalgia, neuropathy, arthritis ) -, by the need to void (Nocturia) 1-2 times.    The preferred sleep position is laterally , with support of 2 pillows, (non- adjustable bed/ recliner ).  The total estimated sleep time is circa 3-6 hours. Dreams are reportedly rare/ frequent/ and can be vivid. Dream enactment has not been reported.   Variable  AM is the usual week- day rise time.    There is no routine,  she may take until 3 AM to go to sleep and she tries to get up by 9.   The patient wakes up spontaneously. she reports not/ mostly/ feeling refreshed and restored in the morning, waking with symptoms such as dry mouth, morning headaches, stiffness or pain, and fatigue.  No sleep paralysis has been experienced.   Naps in daytime are taken infrequently (there is a desire to nap and opportunity), lasting from 20  to 30 and have no refreshing quality. These do not interfere with nocturnal sleep.    Review of Systems: Out of a complete 14 system review, the patient complains of only the following symptoms, and all other reviewed systems are negative.:   Insomnia yes  Depression/ anxiety:  Pain: yes - chronic pain.  Headaches yes    Snoring, Sleep fragmentation,  Nocturia 1-2 times    How likely are you to doze in the following situations: 0 = not likely, 1 = slight chance, 2 = moderate chance, 3 = high chance Sitting and Reading? Watching Television? Sitting inactive in a public place (theater or meeting)? As a passenger in a car for an hour without a break? Lying down  in the afternoon when circumstances permit? Sitting and talking to someone? Sitting quietly after lunch without alcohol? In a car, while stopped for a few minutes in traffic?   Total ESS =18 / 24 points.    FSS endorsed at 59/ 63 points.  GDS:  Social History   Socioeconomic History   Marital status: Married    Spouse name: Not on file   Number of children: Not on file   Years of education: Not on file   Highest education level: Not on file  Occupational History   Not on file  Tobacco Use   Smoking status: Never   Smokeless tobacco: Never  Vaping Use   Vaping status: Never Used  Substance and Sexual Activity   Alcohol use: Yes    Comment: rare   Drug use: Never   Sexual activity: Not on file  Other Topics Concern   Not on file  Social History Narrative   Caffiene tea , no soda   Working no   Live husband 2 daughter and grandson, 1 dog (inside).   Social Drivers of Corporate investment banker Strain: Not on file  Food Insecurity: Low Risk  (03/18/2023)   Received from [facility]   Hunger Vital Sign    Within the past 12 months, you worried that your food would run out before you got money to buy more: Never true    Within the past 12 months, the food you bought just didn't last and you didn't have money to get more. : Never true  Transportation Needs: No Transportation Needs (03/18/2023)   Received from [facility]    In the past 12 months, has lack of reliable transportation kept you from medical appointments, meetings, work or from getting things needed for daily living? : No  Physical Activity: Not on file  Stress: Not on file  Social Connections: Unknown (11/17/2021)   Received from [facility]   Social Network    Social Network: Not on file    Family History  Problem Relation Age of Onset   Hypertension Mother    Diabetes Mother    Stroke Mother    Heart failure Mother    Hypertension Father    Atrial fibrillation Father     Hypertension Sister    Cancer Paternal Aunt    Diabetes Maternal Grandmother    Heart attack Paternal Grandmother    Heart attack Paternal Grandfather    Migraines Neg Hx     Past Medical History:  Diagnosis Date   Asthma    B12 deficiency 07/20/2020   Bilateral carotid bruits 10/26/2019   Cardiac murmur    Carotid artery stenosis 2021   Mild, bilateral   Carpal tunnel syndrome of right wrist  01/01/2019   Chest tightness 01/26/2019   Chronic arthralgias of knees and hips 07/20/2020   Coronary artery disease 2024   Right coronary artery atheromatous vascular disease noted on CT of the abdomen   DOE (dyspnea on exertion) 10/26/2019   Falls    hurt neck, shoulder   Fibromyalgia    History of prediabetes 10/20/2019   Hyperlipidemia 09/28/2020   Inflammatory arthritis 09/28/2020   Long-term use of aspirin therapy 07/20/2020   Malaise and fatigue 10/20/2019   Migraines    Mixed hyperlipidemia 10/20/2019   Moderate persistent asthma 10/20/2019   Morbid obesity (HCC) 01/26/2019   Obesity (BMI 35.0-39.9 without comorbidity) 01/27/2020   Osteoarthritis 09/28/2020   Osteoporosis    Palpitations 09/28/2020   Personal history of COVID-19 10/20/2019   Formatting of this note might be different from the original. 06/14/19   Pneumonia due to COVID-19 virus 06/14/2019   Prediabetes 09/28/2020   Preoperative cardiovascular examination 01/26/2019   Strain of neck muscle 01/01/2019   Vitamin D deficiency 09/28/2020    Past Surgical History:  Procedure Laterality Date   ABDOMINAL HYSTERECTOMY     APPENDECTOMY     CHOLECYSTECTOMY     HAND SURGERY Right    KNEE SURGERY       Current Outpatient Medications on File Prior to Visit  Medication Sig Dispense Refill   albuterol (PROAIR HFA) 108 (90 Base) MCG/ACT inhaler Inhale 2 puffs into the lungs as needed for wheezing or shortness of breath.     clopidogrel  (PLAVIX ) 75 MG tablet Take 1 tablet (75 mg total) by mouth daily. 90 tablet 3    isosorbide  mononitrate (IMDUR ) 30 MG 24 hr tablet Take 1 tablet (30 mg total) by mouth daily. 90 tablet 3   losartan (COZAAR) 25 MG tablet Take 25 mg by mouth daily.     nitroGLYCERIN  (NITROSTAT ) 0.4 MG SL tablet Place 1 tablet (0.4 mg total) under the tongue every 5 (five) minutes as needed for chest pain. 25 tablet 3   rosuvastatin  (CRESTOR ) 20 MG tablet Take 1 tablet (20 mg total) by mouth daily. 90 tablet 3   VITAMIN D, CHOLECALCIFEROL, PO Take by mouth.     No current facility-administered medications on file prior to visit.    Allergies  Allergen Reactions   Ciprofloxacin Diarrhea    Pt stated strong antibiotics give her C-diff for months    Haemophilus B Polysaccharide Vaccine Hives   Amoxicillin-Pot Clavulanate     Other reaction(s): Diarrhea   Aspirin Other (See Comments)    Not able to take   Doxycycline Nausea And Vomiting   Fluzone Quadrivalent [Influenza Vac Split Quad]     Other reaction(s): Hives   Gabapentin     itching   Influenza Vaccines Hives   Meloxicam     Rectal Bleeding   Penicillin G     Diarrhea   Prednisone     short of breath   Tramadol Rash    rash    Vitals:   04/27/24 1558  BP: (!) 145/81  Pulse: 69    IMPRESSION:MRI brain 02-03-2024 :  1.  No evidence of acute intracranial abnormality. 2. Several tiny nonspecific T2 FLAIR hyperintense remote insults within the cerebral white matter, new from the prior brain MRI of 03/01/2006. 3. Otherwise unremarkable MRI appearance the brain.      Physical exam:   General: The patient was alert and appears not in acute distress.  Mood and affect are appropriate .  The patient's interactions  are: Cooperative, makes eye contact, follows the instructions and answers questions coherently.  The patient is groomed and appropriately groomed and dressed. Head: Normocephalic, atraumatic.  Neck is supple. Mallampati:3.  The neck circumference measured 16 inches. Nasal airflow was patent ,    Overbite  was noted. Never had braces  Dental status:  biological  Cardiovascular:  Regular rate and cardiac rhythm by palpable pulse. Respiratory: no audible wheezing, no tachypnoea.   Skin:  Without evidence of ankle edema. No discoloration.  Trunk:  BMI is 43 The patient's posture was erect.   Neurologic exam : The patient was awake and alert, oriented to place and time.   Attention span & concentration ability appeared normal.  Speech was fluent, without dysarthria, dysphonia or aphasia, and of normal volume.     Cranial nerves:  There was no loss of smell or taste reported  Pupils are round, equal in size and briskly reactive to light.  Funduscopic exam was deferred.  Extraocular movements in vertical and horizontal planes were intact and without nystagmus. (No Diplopia reported). Visual fields by finger perimetry are intact. Hearing was intact to soft voice.    Facial sensation intact to fine touch.  Facial motor strength: Symmetric movement and tongue and uvula move midline.  Neck ROM: rotation, tilt and flexion extension were intact for age and shoulder shrug was symmetrical.    Motor exam:  Symmetric bulk, strength and ROM.   Normal tone without cog- wheeling, and symmetric grip strength.   Sensory:   Fine touch and vibration were tested by tuning fork and intact.     Coordination: The patient reported no problems with button closure and no changes to penmanship.   The Finger-to-nose maneuver was intact without evidence of ataxia, dysmetria or tremor.   Gait and station: Patient could rise unassisted from a seated position, without bracing. No limp was noted.     Deep tendon reflexes: Upper extremities did show symmetric DTRs. Lower extremity DTRs were symmetric and brisk/ attenuated.   Babinski response was deferred.   I would like to thank  Ines Onetha NOVAK, Md [address] [place],  [place] [address] for allowing me to meet with her patient .    In  short, she is presenting with :  Chronic  fatigue,   Excessive daytime sleepiness  Chronic pain and sleep deprivation.  Snoring.  Morning headaches.    Deterioration of sleep routines,  bedtime, rise time,   I am providing a work up for organic sleep disorders .    Risk factors for OSA were present,  including : Body mass index is 42.54 kg/m., neck size 16   and upper airway anatomy.   My Plan is to proceed with:  HST/ PSG  1)  I want to screen this patient for sleep apnea.   2)  History of serotonin reaction on Zoloft.  3)  excessive sleepiness and fatigue , possibly related to mood , to pain, or to OSA.    I plan to follow up through our NP within 5 months.   A total time of  60  minutes consistent of a part of face to face encounter , exam and interview,  and additional preparation time for chart review was spent .   At today's visit, we discussed treatment options, associated risk and benefits, and engage in counseling as needed including, but not limited to:  Sleep hygiene,  Quality Sleep Habits,  and Safety concerns for patients with daytime  sleepiness who are warned to not operate machinery/ motor vehicles when drowsy.  Risk factors for sleep apnea were identified:Additionally, the following were reviewed: Past medical records, past medical and surgical history, family and social background, as well as relevant laboratory results, imaging findings, and medical notes, where applicable.  This note was generated by myself in part by using dictation software, and as a result, it may contain unintentional typos and errors.  Nevertheless, effort was made to accurately convey the pertinent aspects of the patient's visit.   Dedra Gores, MD  [facility] Sleep Board certified in Sleep Medicine by The [employer] of Sleep Medicine and Diplomate of the [employer] (AASM) . Board certified In Neurology, Diplomat of  the ABPN,  Fellow of the [employer] Neu[employer].

## 2024-04-27 NOTE — Patient Instructions (Addendum)
 Fatigue If you have fatigue, you feel tired all the time and have a lack of energy or a lack of motivation. Fatigue may make it difficult to start or complete tasks because of exhaustion. Occasional or mild fatigue is often a normal response to activity or life. However, long-term (chronic) or extreme fatigue may be a symptom of a medical condition such as: Depression. Not having enough red blood cells or hemoglobin in the blood (anemia). A problem with a small gland located in the lower front part of the neck (thyroid  disorder). Rheumatologic conditions. These are problems related to the body's defense system (immune system). Infections, especially certain viral infections. Fatigue can also lead to negative health outcomes over time. Follow these instructions at home: Medicines Take over-the-counter and prescription medicines only as told by your health care provider. Take a multivitamin if told by your health care provider. Do not use herbal or dietary supplements unless they are approved by your health care provider. Eating and drinking  Avoid heavy meals in the evening. Eat a well-balanced diet, which includes lean proteins, whole grains, plenty of fruits and vegetables, and low-fat dairy products. Avoid eating or drinking too many products with caffeine in them. Avoid alcohol. Drink enough fluid to keep your urine pale yellow. Activity  Exercise regularly, as told by your health care provider. Use or practice techniques to help you relax, such as yoga, tai chi, meditation, or massage therapy. Lifestyle Change situations that cause you stress. Try to keep your work and personal schedules in balance. Do not use recreational or illegal drugs. General instructions Monitor your fatigue for any changes. Go to bed and get up at the same time every day. Avoid fatigue by pacing yourself during the day and getting enough sleep at night. Maintain a healthy weight. Contact a health care  provider if: Your fatigue does not get better. You have a fever. You suddenly lose or gain weight. You have headaches. You have trouble falling asleep or sleeping through the night. You feel angry, guilty, anxious, or sad. You have swelling in your legs or another part of your body. Get help right away if: You feel confused, feel like you might faint, or faint. Your vision is blurry or you have a severe headache. You have severe pain in your abdomen, your back, or the area between your waist and hips (pelvis). You have chest pain, shortness of breath, or an irregular or fast heartbeat. You are unable to urinate, or you urinate less than normal. You have abnormal bleeding from the rectum, nose, lungs, nipples, or, if you are female, the vagina. You vomit blood. You have thoughts about hurting yourself or others. These symptoms may be an emergency. Get help right away. Call 911. Do not wait to see if the symptoms will go away. Do not drive yourself to the hospital. Get help right away if you feel like you may hurt yourself or others, or have thoughts about taking your own life. Go to your nearest emergency room or: Call 911. Call the National Suicide Prevention Lifeline at 440-579-2649 or 988. This is open 24 hours a day. Text the Crisis Text Line at 334 069 4318. Summary If you have fatigue, you feel tired all the time and have a lack of energy or a lack of motivation. Fatigue may make it difficult to start or complete tasks because of exhaustion. Long-term (chronic) or extreme fatigue may be a symptom of a medical condition. Exercise regularly, as told by your health care provider.  Change situations that cause you stress. Try to keep your work and personal schedules in balance. This information is not intended to replace advice given to you by your health care provider. Make sure you discuss any questions you have with your health care provider. Document Revised: 04/17/2021 Document  Reviewed: 04/17/2021 Elsevier Patient Education  2024 Elsevier Inc.  Quality Sleep Information, Adult Quality sleep is important for your mental and physical health. It also improves your quality of life. Quality sleep means you: Are asleep for most of the time you are in bed. Fall asleep within 30 minutes. Wake up no more than once a night. Are awake for no longer than 20 minutes if you do wake up during the night. Most adults need 7-8 hours of quality sleep each night. How can poor sleep affect me? If you do not get enough quality sleep, you may have: Mood swings. Daytime sleepiness. Decreased alertness, reaction time, and concentration. Sleep disorders, such as insomnia and sleep apnea. Difficulty with: Solving problems. Coping with stress. Paying attention. These issues may affect your performance and productivity at work, school, and home. Lack of sleep may also put you at higher risk for accidents, suicide, and risky behaviors. If you do not get quality sleep, you may also be at higher risk for several health problems, including: Infections. Type 2 diabetes. Heart disease. High blood pressure. Obesity. Worsening of long-term conditions, like arthritis, kidney disease, depression, Parkinson's disease, and epilepsy. What actions can I take to get more quality sleep? Sleep schedule and routine Stick to a sleep schedule. Go to sleep and wake up at about the same time each day. Do not try to sleep less on weekdays and make up for lost sleep on weekends. This does not work. Limit naps during the day to 30 minutes or less. Do not take naps in the late afternoon. Make time to relax before bed. Reading, listening to music, or taking a hot bath promotes quality sleep. Make your bedroom a place that promotes quality sleep. Keep your bedroom dark, quiet, and at a comfortable room temperature. Make sure your bed is comfortable. Avoid using electronic devices that give off bright blue  light for 30 minutes before bedtime. Your brain perceives bright blue light as sunlight. This includes television, phones, and computers. If you are lying awake in bed for longer than 20 minutes, get up and do a relaxing activity until you feel sleepy. Lifestyle     Try to get at least 30 minutes of exercise on most days. Do not exercise 2-3 hours before going to bed. Do not use any products that contain nicotine or tobacco. These products include cigarettes, chewing tobacco, and vaping devices, such as e-cigarettes. If you need help quitting, ask your health care provider. Do not drink caffeinated beverages for at least 8 hours before going to bed. Coffee, tea, and some sodas contain caffeine. Do not drink alcohol or eat large meals close to bedtime. Try to get at least 30 minutes of sunlight every day. Morning sunlight is best. Medical concerns Work with your health care provider to treat medical conditions that may affect sleeping, such as: Nasal obstruction. Snoring. Sleep apnea and other sleep disorders. Talk to your health care provider if you think any of your prescription medicines may cause you to have difficulty falling or staying asleep. If you have sleep problems, talk with a sleep consultant. If you think you have a sleep disorder, talk with your health care provider about getting evaluated  by a specialist. Where to find more information Sleep Foundation: sleepfoundation.org American Academy of Sleep Medicine: aasm.org Centers for Disease Control and Prevention (CDC): TonerPromos.no Contact a health care provider if: You have trouble getting to sleep or staying asleep. You often wake up very early in the morning and cannot get back to sleep. You have daytime sleepiness. You have daytime sleep attacks of suddenly falling asleep and sudden muscle weakness (narcolepsy). You have a tingling sensation in your legs with a strong urge to move your legs (restless legs syndrome). You stop  breathing briefly during sleep (sleep apnea). You think you have a sleep disorder or are taking a medicine that is affecting your quality of sleep. Summary Most adults need 7-8 hours of quality sleep each night. Getting enough quality sleep is important for your mental and physical health. Make your bedroom a place that promotes quality sleep, and avoid things that may cause you to have poor sleep, such as alcohol, caffeine, smoking, or large meals. Talk to your health care provider if you have trouble falling asleep or staying asleep. This information is not intended to replace advice given to you by your health care provider. Make sure you discuss any questions you have with your health care provider. Document Revised: 10/18/2021 Document Reviewed: 10/18/2021 Elsevier Patient Education  2024 Elsevier Inc.  Hypersomnia Hypersomnia is a condition in which a person feels very tired during the day even though the person gets plenty of sleep at night. A person with this condition may take naps during the day and may find it very difficult to wake up from sleep. Hypersomnia may affect a person's ability to think, concentrate, drive, or remember things. What are the causes? The cause of this condition may not be known. Possible causes include: Taking certain medicines. Using drugs or alcohol. Sleep disorders, such as narcolepsy and sleep apnea. Injury to the head, brain, or spinal cord. Tumors. Certain medical conditions. These include: Depression. Diabetes. Gastroesophageal reflux disease (GERD). An underactive thyroid  gland (hypothyroidism). What are the signs or symptoms? The main symptoms of hypersomnia include: Feeling very tired throughout the day, regardless of how much sleep you got the night before. Having trouble waking up. Others may find it difficult to wake you up when you are sleeping. Sleeping for longer and longer periods at a time. Taking naps throughout the day. Other  symptoms may include: Feeling restless, anxious, or annoyed. Lacking energy. Having trouble with: Remembering. Speaking. Thinking. Loss of appetite. Seeing, hearing, tasting, smelling, or feeling things that are not real (hallucinations). How is this diagnosed? This condition may be diagnosed based on: Your symptoms and medical history. Your sleeping habits. Your health care provider may ask you to write down your sleeping habits in a daily sleep log, along with any symptoms you have. A series of tests that are done while you sleep (sleep study or polysomnogram). A test that measures how quickly you can fall asleep during the day (daytime nap study or multiple sleep latency test). How is this treated? This condition may be treated by: Following a regular sleep routine. Making lifestyle changes, such as changing your eating habits, getting regular exercise, and avoiding alcohol or caffeinated beverages. Taking medicines to make you more alert (stimulants) during the day. Treating any underlying medical causes of hypersomnia. Follow these instructions at home: Sleep habits Stick to a routine that includes going to bed and waking up at the same times every day and night. Practice a relaxing bedtime routine. This may  include reading, meditation, deep breathing, or taking a warm bath before going to sleep. Exercise regularly as told by your health care provider. However, avoid exercising in the hours right before bedtime. Keep your sleep environment at a cooler temperature, darkened, and quiet. Sleep with pillows and a mattress that are comfortable and supportive. Schedule short 20-minute naps for when you feel sleepiest during the day. Talk with your employer or teachers about your hypersomnia. If possible, adjust your schedule so that: You have a regular daytime work schedule. You can take a scheduled nap during the day. You do not have to work or be active at night. Do not eat a heavy  meal for a few hours before bedtime. Eat your meals at about the same times every day. Safety  Do not drive or use machinery if you are sleepy. Ask your health care provider if it is safe for you to drive. Wear a life jacket when swimming or spending time near water. General instructions  Take over-the-counter and prescription medicines only as told by your health care provider. This includes supplements. Avoid drinking alcohol or caffeinated beverages. Keep a sleep log that will help your health care provider manage your condition. This may include information about: What time you go to bed each night. How often you wake up at night. How many hours you sleep at night. How often and for how long you nap during the day. Any observations from others, such as leg movements during sleep, sleep walking, or snoring. Keep all follow-up visits. This is important. Contact a health care provider if: You have new symptoms. Your symptoms get worse. Get help right away if: You have thoughts about hurting yourself or someone else. Get help right away if you feel like you may hurt yourself or others, or have thoughts about taking your own life. Go to your nearest emergency room or: Call 911. Call the National Suicide Prevention Lifeline at (647) 545-5762 or 988. This is open 24 hours a day. Text the Crisis Text Line at 562 520 3864. Summary Hypersomnia refers to a condition in which you feel very tired during the day even though you get plenty of sleep at night. A person with this condition may take naps during the day and may find it very difficult to wake up from sleep. Hypersomnia may affect a person's ability to think, concentrate, drive, or remember things. Treatment may include a regular sleep routine and making some lifestyle changes. This information is not intended to replace advice given to you by your health care provider. Make sure you discuss any questions you have with your health care  provider. Document Revised: 06/05/2021 Document Reviewed: 06/05/2021 Elsevier Patient Education  2024 Elsevier Inc.    I would like to thank  Ines Onetha NOVAK, Md [address] [place],  [place] [address] for allowing me to meet with her patient .      In short, she is presenting with :   Chronic  fatigue,    Excessive daytime sleepiness   Chronic pain and sleep deprivation.   Snoring.   Morning headaches.     Deterioration of sleep routines,  bedtime, rise time,    I am providing a work up for organic sleep disorders .      Risk factors for OSA were present,  including : Body mass index is 42.54 kg/m., neck size 16   and upper airway anatomy.    My Plan is to proceed with:   HST/ PSG  1)  I want to screen this patient for sleep apnea.   2)  History of serotonin reaction on Zoloft.  3)  excessive sleepiness and fatigue , possibly related to mood , to pain, or to OSA.      I plan to follow up /through our NP within 5 months.

## 2024-06-10 ENCOUNTER — Ambulatory Visit: Admitting: Neurology

## 2024-06-10 DIAGNOSIS — R519 Headache, unspecified: Secondary | ICD-10-CM

## 2024-06-10 DIAGNOSIS — G4733 Obstructive sleep apnea (adult) (pediatric): Secondary | ICD-10-CM | POA: Diagnosis not present

## 2024-06-10 DIAGNOSIS — G9332 Myalgic encephalomyelitis/chronic fatigue syndrome: Secondary | ICD-10-CM

## 2024-06-10 DIAGNOSIS — G4719 Other hypersomnia: Secondary | ICD-10-CM

## 2024-06-11 NOTE — Progress Notes (Unsigned)
 SABRA

## 2024-06-17 ENCOUNTER — Ambulatory Visit: Payer: Self-pay | Admitting: Neurology

## 2024-06-17 NOTE — Procedures (Signed)
 [facility]  Karen Vargas 53 year old female 29-May-1971   HOME SLEEP TEST REPORT ( by [facility])   STUDY DATE:  06-10-2024    ORDERING CLINICIAN: Darice Sprague, NP  REFERRING CLINICIAN: Ines Onetha KATHEE, Md [address] [place],  [place] [address]      CLINICAL INFORMATION/HISTORY: Dr Sharion patient presented with : obesity, CAD, pre-diabetes, Asthma, uncontrolled moring headaches and excessive daytime sleepiness. Insomnia yes  Depression/ anxiety:  Pain: yes - chronic pain.  Headaches yes    Snoring, Sleep fragmentation,  Nocturia 1-2 times      Epworth sleepiness score:18 / 24 points.    FSS endorsed at 59/ 63 points.  GDS:    BMI: 43 kg/m   Neck Circumference: 16     FINDINGS:   Sleep Summary:   Total Recording Time (hours, min):  9 h 15 m      Total Sleep Time (hours, min):    8 h 34 m             Percent REM (%):  16.2/h                                       Respiratory Indices:   Calculated pAHI (per AASM guideline): 10.5/h                         REM pAHI:   29.8/h (!)                                               NREM pAHI:   6.7/h                            Positional AHI:  18 minutes of left lateral sleep were associated with the highest AHI.     Snoring:   40 d B mean Volume ( mild)                                              Oxygen Saturation Statistics:   Oxygen Saturation (%) Mean:  94 %, with  an O2 Saturation Range (%) from   88% through 98 %                                    O2 Saturation (minutes) <89%: 0 minutes           Pulse Rate Statistics:   Pulse Mean (bpm):  63 bpm, between 43 and 96 bpm.                         IMPRESSION:  This HST confirms the presence of  mild obstructive sleep apnea , with a strong REM sleep dependency.  REM sleep apneas were almost 5 times more frequent than NREM sleep apneas and hypopneas.    RECOMMENDATION: The REM sleep dependent pattern makes this apnea more likely to  respond to PAP therapy than dental  devices or hypoglossal nerve stimulator ( these will treat snoring more effective that shallow breathing) .   I will order CPAP therapy as provided buy a RESMED autotitration device with a setting from 5-15 cm water, 3 cm EPR , and mask of patient's choice (this doesn't have to be a FFM , one can combine nasal interfaces with a chin strap)   Weight loss benefits REM sleep apnea, with a BMI goal of  less than 32  Any tired or sleepy patient should be cautioned not to drive, work at heights, or operate dangerous or heavy equipment .  Review of good sleep hygiene measures is accessible to any sleep clinic patient and can be reiterated through online material- I we recommend the Guide to better Sleep   by the NIH.   Weight loss and Core Strength improvement is highly recommended for individuals with low muscle tone and/ or a BMI over 30.  Any CPAP patient should be reminded to be fully compliant with PAP therapy , (defined as using PAP therapy for more than 4 hours each night ) with the goal to improve sleep related symptoms and decrease long term cardiovascular risks. Any PAP therapy patient should be reminded, that it may take up to 3 months to get fully used to using PAP and it may take 1-2 weeks for an established CPAP user to acclimatize to changes in pressure or mask. The earlier full compliance is achieved, the better long term compliance tends to be.   Please note that untreated obstructive sleep apnea may carry additional perioperative morbidity. Patients with significant obstructive sleep apnea should receive perioperative PAP therapy and the surgical team should be informed of the diagnosis and degree of sleep disordered breathing.  Sleep fragmentation in the presence of normal proportional sleep stages is a nonspecific finding and per se does not signify an intrinsic sleep disorder or a cause for the patient's sleep-related symptoms.   Causes include (but  are not limited to) the unfamiliarity of sleeping while recorded by HST device or sleeping in a sleep lab for a full Polysomnography sleep study, but also circadian rhythm disturbances, medication side effects or an underlying mood disorder or medical problem.   The referring physician will be notified of the test results.      INTERPRETING PHYSICIAN: Dedra Gores, MD  [facility] Sleep Board certified by The [employer] Medicine and Diplomate of the [employer] of Sleep Medicine. Board certified In Neurology through the ABPN, Fellow of the [employer] of Neu[employer].

## 2024-06-24 NOTE — Telephone Encounter (Signed)
 Rei Medlen D, CMA  Zott, Gasper Ona, Tammy; Darrel Boyer New orders have been placed for the above pt, DOB: [birth year] Thanks

## 2024-06-27 ENCOUNTER — Encounter (HOSPITAL_BASED_OUTPATIENT_CLINIC_OR_DEPARTMENT_OTHER): Payer: Self-pay

## 2024-06-27 ENCOUNTER — Ambulatory Visit (HOSPITAL_BASED_OUTPATIENT_CLINIC_OR_DEPARTMENT_OTHER)
Admission: EM | Admit: 2024-06-27 | Discharge: 2024-06-27 | Disposition: A | Discharge status: Home / Self Care | Attending: Family Medicine | Admitting: Family Medicine

## 2024-06-27 DIAGNOSIS — J0110 Acute frontal sinusitis, unspecified: Secondary | ICD-10-CM

## 2024-06-27 MED ORDER — AMOXICILLIN 875 MG PO TABS: 875.0000 mg | ORAL_TABLET | Freq: Two times a day (BID) | ORAL | 0 refills | Status: AC

## 2024-06-27 MED ORDER — PREDNISONE 20 MG PO TABS: 40.0000 mg | ORAL_TABLET | Freq: Every day | ORAL | 0 refills | Status: AC

## 2024-06-27 MED ORDER — DEXAMETHASONE SOD PHOSPHATE PF 10 MG/ML IJ SOLN
10.0000 mg | Freq: Once | INTRAMUSCULAR | Status: AC
  Administered 2024-06-27: 10 mg via INTRAMUSCULAR

## 2024-06-27 NOTE — Discharge Instructions (Addendum)
 Treating you for a sinus infection. Take the medications as prescribed.  Steroid injection given here.

## 2024-06-27 NOTE — ED Provider Notes (Signed)
 " [facility]    CSN: [id number] Arrival date & time: 06/27/24  1118      History   Chief Complaint Chief Complaint  Patient presents with   sinus drainage    HPI Karen Vargas is a 53 y.o. female.   Patient is a 53 year old female presents today with onset 1 week ago, patient began having sinus drainage. States feels as if it's draining into lungs. States has frequent bouts of bronchitis and usually needs kenalog , antibiotic, and oral steroids.  Low-grade fever here today.      Past Medical History:  Diagnosis Date   Asthma    B12 deficiency 07/20/2020   Bilateral carotid bruits 10/26/2019   Cardiac murmur    Carotid artery stenosis 2021   Mild, bilateral   Carpal tunnel syndrome of right wrist 01/01/2019   Chest tightness 01/26/2019   Chronic arthralgias of knees and hips 07/20/2020   Coronary artery disease 2024   Right coronary artery atheromatous vascular disease noted on CT of the abdomen   DOE (dyspnea on exertion) 10/26/2019   Falls    hurt neck, shoulder   Fibromyalgia    History of prediabetes 10/20/2019   Hyperlipidemia 09/28/2020   Inflammatory arthritis 09/28/2020   Long-term use of aspirin therapy 07/20/2020   Malaise and fatigue 10/20/2019   Migraines    Mixed hyperlipidemia 10/20/2019   Moderate persistent asthma 10/20/2019   Morbid obesity (HCC) 01/26/2019   Obesity (BMI 35.0-39.9 without comorbidity) 01/27/2020   Osteoarthritis 09/28/2020   Osteoporosis    Palpitations 09/28/2020   Personal history of COVID-19 10/20/2019   Formatting of this note might be different from the original. 06/14/19   Pneumonia due to COVID-19 virus 06/14/2019   Prediabetes 09/28/2020   Preoperative cardiovascular examination 01/26/2019   Strain of neck muscle 01/01/2019   Vitamin D deficiency 09/28/2020    Patient Active Problem List   Diagnosis Date Noted   Chronic fatigue syndrome 04/27/2024   Excessive daytime sleepiness 02/04/2024    Uncontrolled morning headache 02/04/2024   Coronary artery disease posterolateral branch stenosis based on coronary CT angio 01/30/2024   Inflammatory arthritis 09/28/2020   Osteoarthritis 09/28/2020   Prediabetes 09/28/2020   Vitamin D deficiency 09/28/2020   Hyperlipidemia 09/28/2020   Palpitations 09/28/2020   Cardiac murmur    Fibromyalgia    Osteoporosis    B12 deficiency 07/20/2020   Chronic arthralgias of knees and hips 07/20/2020   Long-term use of aspirin therapy 07/20/2020   Asthma 01/27/2020   Obesity (BMI 35.0-39.9 without comorbidity) 01/27/2020   DOE (dyspnea on exertion) 10/26/2019   Bilateral carotid bruits 10/26/2019   History of prediabetes 10/20/2019   Malaise and fatigue 10/20/2019   Mixed hyperlipidemia 10/20/2019   Moderate persistent asthma 10/20/2019   Personal history of COVID-19 10/20/2019   Pneumonia due to COVID-19 virus 06/14/2019   Preoperative cardiovascular examination 01/26/2019   Chest tightness 01/26/2019   Morbid obesity (HCC) 01/26/2019   Carpal tunnel syndrome of right wrist 01/01/2019   Strain of neck muscle 01/01/2019    Past Surgical History:  Procedure Laterality Date   ABDOMINAL HYSTERECTOMY     APPENDECTOMY     CHOLECYSTECTOMY     HAND SURGERY Right    KNEE SURGERY      OB History   No obstetric history on file.      Home Medications    Prior to Admission medications  Medication Sig Start Date End Date Taking? Authorizing Provider  amoxicillin  (AMOXIL ) 875  MG tablet Take 1 tablet (875 mg total) by mouth 2 (two) times daily for 7 days. 06/27/24 07/04/24 Yes Faisal Stradling A, FNP  predniSONE  (DELTASONE ) 20 MG tablet Take 2 tablets (40 mg total) by mouth daily with breakfast for 5 days. 06/27/24 07/02/24 Yes Layna Roeper A, FNP  albuterol (PROAIR HFA) 108 (90 Base) MCG/ACT inhaler Inhale 2 puffs into the lungs as needed for wheezing or shortness of breath.    [provider]  clopidogrel  (PLAVIX ) 75 MG tablet Take 1  tablet (75 mg total) by mouth daily. 09/06/23   Carlin Delon BROCKS, NP  isosorbide  mononitrate (IMDUR ) 30 MG 24 hr tablet Take 1 tablet (30 mg total) by mouth daily. 09/06/23 04/27/24  Carlin Delon BROCKS, NP  losartan (COZAAR) 25 MG tablet Take 25 mg by mouth daily. 03/23/24   [provider]  nitroGLYCERIN  (NITROSTAT ) 0.4 MG SL tablet Place 1 tablet (0.4 mg total) under the tongue every 5 (five) minutes as needed for chest pain. 08/05/23 04/27/24  Carlin Delon BROCKS, NP  rosuvastatin  (CRESTOR ) 20 MG tablet Take 1 tablet (20 mg total) by mouth daily. 08/08/23 04/27/24  Carlin Delon BROCKS, NP  VITAMIN D, CHOLECALCIFEROL, PO Take by mouth.    [provider]    Family History Family History  Problem Relation Age of Onset   Hypertension Mother    Diabetes Mother    Stroke Mother    Heart failure Mother    Hypertension Father    Atrial fibrillation Father    Hypertension Sister    Cancer Paternal Aunt    Diabetes Maternal Grandmother    Heart attack Paternal Grandmother    Heart attack Paternal Grandfather    Migraines Neg Hx     Social History Social History[1]   Allergies   Ciprofloxacin, Haemophilus b polysaccharide vaccine, Amoxicillin -pot clavulanate, Aspirin, Doxycycline, Fluzone quadrivalent [influenza vac split quad], Gabapentin, Influenza vaccines, Meloxicam, Penicillin g, Prednisone , and Tramadol   Review of Systems Review of Systems See HPI  Physical Exam Triage Vital Signs ED Triage Vitals  Encounter Vitals Group     BP 06/27/24 1233 123/77     Girls Systolic BP Percentile --      Girls Diastolic BP Percentile --      Boys Systolic BP Percentile --      Boys Diastolic BP Percentile --      Pulse Rate 06/27/24 1233 87     Resp 06/27/24 1233 20     Temp 06/27/24 1233 99 F (37.2 C)     Temp Source 06/27/24 1233 Oral     SpO2 06/27/24 1233 97 %     Weight --      Height --      Head Circumference --      Peak Flow --      Pain Score 06/27/24 1235  3     Pain Loc --      Pain Education --      Exclude from Growth Chart --    No data found.  Updated Vital Signs BP 123/77 (BP Location: Right Arm)   Pulse 87   Temp 99 F (37.2 C) (Oral)   Resp 20   SpO2 97%   Visual Acuity Right Eye Distance:   Left Eye Distance:   Bilateral Distance:    Right Eye Near:   Left Eye Near:    Bilateral Near:     Physical Exam Constitutional:      General: She is not in acute  distress.    Appearance: Normal appearance. She is not ill-appearing, toxic-appearing or diaphoretic.  HENT:     Head: Normocephalic and atraumatic.     Right Ear: Tympanic membrane and ear canal normal.     Left Ear: Tympanic membrane and ear canal normal.     Nose: Congestion and rhinorrhea present.     Mouth/Throat:     Pharynx: Oropharynx is clear.  Eyes:     Conjunctiva/sclera: Conjunctivae normal.  Cardiovascular:     Rate and Rhythm: Normal rate and regular rhythm.     Pulses: Normal pulses.     Heart sounds: Normal heart sounds.  Pulmonary:     Effort: Pulmonary effort is normal.     Breath sounds: Normal breath sounds.  Skin:    General: Skin is warm and dry.  Neurological:     Mental Status: She is alert.  Psychiatric:        Mood and Affect: Mood normal.      UC Treatments / Results  Labs (all labs ordered are listed, but only abnormal results are displayed) Labs Reviewed - No data to display  EKG   Radiology No results found.  Procedures Procedures (including critical care time)  Medications Ordered in UC Medications  dexamethasone  (DECADRON ) injection 10 mg (10 mg Intramuscular Given 06/27/24 1301)    Initial Impression / Assessment and Plan / UC Course  I have reviewed the triage vital signs and the nursing notes.  Pertinent labs & imaging results that were available during my care of the patient were reviewed by me and considered in my medical decision making (see chart for details).     Acute sinusitis-patient with 1  week of worsening symptoms.  Will treat for bacterial sinus infection at this time.  Antibiotics as prescribed.  Steroid injection given here in clinic and prescribed prednisone  to start in the morning.  Over-the-counter medicines for symptoms as needed.  Follow-up as needed Final Clinical Impressions(s) / UC Diagnoses   Final diagnoses:  Acute non-recurrent frontal sinusitis     Discharge Instructions      Treating you for a sinus infection. Take the medications as prescribed.  Steroid injection given here.      ED Prescriptions     Medication Sig Dispense Auth. Provider   amoxicillin  (AMOXIL ) 875 MG tablet Take 1 tablet (875 mg total) by mouth 2 (two) times daily for 7 days. 14 tablet Jarvis Sawa A, FNP   predniSONE  (DELTASONE ) 20 MG tablet Take 2 tablets (40 mg total) by mouth daily with breakfast for 5 days. 10 tablet Adah Wilbert LABOR, FNP      PDMP not reviewed this encounter.     [1]  Social History Tobacco Use   Smoking status: Never   Smokeless tobacco: Never  Vaping Use   Vaping status: Never Used  Substance Use Topics   Alcohol use: Yes    Comment: rare   Drug use: Never     Adah Wilbert LABOR, FNP 06/28/24 1313  "

## 2024-06-27 NOTE — ED Triage Notes (Signed)
 Onset 1 week ago, patient began having sinus drainage. States feels as if it's draining into lungs. States has frequent bouts of bronchitis and usually needs kenalog , antibiotic, and oral steroids.
